# Patient Record
Sex: Female | Born: 1950
Health system: Southern US, Community
[De-identification: ages and names within clinical notes are randomized; demographics above are authoritative.]

## PROBLEM LIST (undated history)

## (undated) DIAGNOSIS — J45909 Unspecified asthma, uncomplicated: Secondary | ICD-10-CM

## (undated) HISTORY — DX: Unspecified asthma, uncomplicated: J45.909

---

## 2015-02-10 ENCOUNTER — Telehealth: Payer: Self-pay | Admitting: Oncology

## 2015-02-10 NOTE — Telephone Encounter (Signed)
NEW PATIENT APPT-S/W PATIENT AND GAVE NP APPT FOR 12/06 @ 1:30 W/dR. SHERRILL.

## 2015-04-11 ENCOUNTER — Telehealth: Payer: Self-pay | Admitting: *Deleted

## 2015-04-11 DIAGNOSIS — C189 Malignant neoplasm of colon, unspecified: Secondary | ICD-10-CM

## 2015-04-11 NOTE — Telephone Encounter (Signed)
Oncology Nurse Navigator Documentation  Oncology Nurse Navigator Flowsheets 04/11/2015  Referral date to RadOnc/MedOnc 02/05/2015  Navigator Encounter Type Introductory phone call  Called patient to confirm her appointment on 04/15/15. She does not have a CD of her scans or any other records at home. She does have the contact information from her surgeon in [place] if more records are needed. Records received have no operative note, colonoscopy reports, pathology report or actual scan reports.

## 2015-04-15 ENCOUNTER — Encounter: Payer: Self-pay | Admitting: Oncology

## 2015-04-15 ENCOUNTER — Other Ambulatory Visit (HOSPITAL_BASED_OUTPATIENT_CLINIC_OR_DEPARTMENT_OTHER): Payer: No Typology Code available for payment source

## 2015-04-15 ENCOUNTER — Encounter: Payer: Self-pay | Admitting: *Deleted

## 2015-04-15 ENCOUNTER — Ambulatory Visit (HOSPITAL_BASED_OUTPATIENT_CLINIC_OR_DEPARTMENT_OTHER): Payer: No Typology Code available for payment source | Admitting: Oncology

## 2015-04-15 VITALS — BP 138/80 | HR 87 | Temp 98.1°F | Resp 16 | Ht 60.0 in | Wt 167.0 lb

## 2015-04-15 DIAGNOSIS — J45909 Unspecified asthma, uncomplicated: Secondary | ICD-10-CM

## 2015-04-15 DIAGNOSIS — Z85038 Personal history of other malignant neoplasm of large intestine: Secondary | ICD-10-CM | POA: Diagnosis not present

## 2015-04-15 DIAGNOSIS — C189 Malignant neoplasm of colon, unspecified: Secondary | ICD-10-CM | POA: Insufficient documentation

## 2015-04-15 LAB — COMPREHENSIVE METABOLIC PANEL
ALBUMIN: 4 g/dL (ref 3.5–5.0)
ALT: 50 U/L (ref 0–55)
AST: 37 U/L — AB (ref 5–34)
Alkaline Phosphatase: 80 U/L (ref 40–150)
Anion Gap: 10 mEq/L (ref 3–11)
BUN: 19.9 mg/dL (ref 7.0–26.0)
CO2: 23 mEq/L (ref 22–29)
Calcium: 9.8 mg/dL (ref 8.4–10.4)
Chloride: 108 mEq/L (ref 98–109)
Creatinine: 0.8 mg/dL (ref 0.6–1.1)
EGFR: 79 mL/min/{1.73_m2} — ABNORMAL LOW (ref >90)
GLUCOSE: 132 mg/dL (ref 70–140)
Potassium: 3.7 mEq/L (ref 3.5–5.1)
SODIUM: 140 meq/L (ref 136–145)
TOTAL PROTEIN: 7.6 g/dL (ref 6.4–8.3)
Total Bilirubin: <0.3 mg/dL (ref 0.20–1.20)

## 2015-04-15 LAB — CBC WITH DIFFERENTIAL/PLATELET
BASO%: 0.7 % (ref 0.0–2.0)
Basophils Absolute: 0 10*3/uL (ref 0.0–0.1)
EOS ABS: 0.2 10*3/uL (ref 0.0–0.5)
EOS%: 3.1 % (ref 0.0–7.0)
HCT: 42.4 % (ref 34.8–46.6)
HEMOGLOBIN: 14.2 g/dL (ref 11.6–15.9)
LYMPH%: 29 % (ref 14.0–49.7)
MCH: 29.4 pg (ref 25.1–34.0)
MCHC: 33.5 g/dL (ref 31.5–36.0)
MCV: 87.7 fL (ref 79.5–101.0)
MONO#: 0.6 10*3/uL (ref 0.1–0.9)
MONO%: 9.1 % (ref 0.0–14.0)
NEUT%: 58.1 % (ref 38.4–76.8)
NEUTROS ABS: 4 10*3/uL (ref 1.5–6.5)
Platelets: 282 10*3/uL (ref 145–400)
RBC: 4.83 10*6/uL (ref 3.70–5.45)
RDW: 13.4 % (ref 11.2–14.5)
WBC: 7 10*3/uL (ref 3.9–10.3)
lymph#: 2 10*3/uL (ref 0.9–3.3)

## 2015-04-15 NOTE — Progress Notes (Signed)
Oncology Nurse Navigator Documentation  Oncology Nurse Navigator Flowsheets 04/15/2015  Referral date to RadOnc/MedOnc -  Navigator Encounter Type Initial MedOnc  Patient Visit Type Medonc  Treatment Phase No treatment-observation  Barriers/Navigation Needs No barriers at this time  Support Groups/Services GI and [place] [facility]  Time Spent with Patient 15  Met with patient after MD visit and provided contact information for Dr. Benay Spice and GI Navigator. Will followup to ensure her GI referral is completed. Does not require medical oncologist to follow her at this time. Needs PCP and followup there.

## 2015-04-15 NOTE — Progress Notes (Signed)
[facility] New Patient Consult   Referring MD: Karren Burly, [facility] 64 y.o.  [birth year]    Reason for Referral: Colon cancer   HPI: She reports being diagnosed with colon cancer on her first screening colonoscopy in 2009. We do not have the colonoscopy report or initial biopsy result available today. She underwent surgical resection of the primary tumor 05/08/2008  and was noted to have a well to moderately differentiated adenocarcinoma measuring 3.7 cm and invaded into but not through the muscularis propria. 12 lymph nodes were negative for tumor. She was referred to Dr. Olevia Bowens and adjuvant chemotherapy was not recommended.  She has been followed by Dr. Olevia Bowens and was last seen in December 2015. She reports a surveillance colonoscopy is due in 2017. The CEA returned at less than 0.5 on 04/09/2014.  Erica [name] has relocated to [facility]. She is referred to establish oncology care here. She will see Dr.John Laurann Montana for internal medicine care.  She feels well. No specific complaint today.  Past medical history: 1. Asthma/allergies 2. G2 P2   Past surgical history: 1. Hysterectomy for menorrhagia greater than 20 years ago   Medications: Reviewed  Allergies:  Allergies  Allergen Reactions  . Betadine [Povidone Iodine] Rash    Rash after surgery- likely related to Betadine    Family history: A maternal aunt was diagnosed with breast cancer at age 42. No other family history of cancer.  Social History:   She lives in [place] with her husband. She is retired after working in [employer]. Rare alcohol use. No tobacco use. No risk factor for HIV or hepatitis.     ROS:   Positives include: Intermittent flares of asthma-usually in the fall and spring. Irregular bowel habits. Recent rash over the right knee that has resolved  A complete ROS was otherwise negative.  Physical Exam:  Blood pressure 181/71, pulse 87,  temperature 98.1 F (36.7 C), temperature source Oral, resp. rate 16, height 5' (1.524 m), weight 167 lb (75.751 kg), SpO2 100 %.  HEENT: Neck without mass Lungs: Clear bilaterally Cardiac: Regular rate and rhythm Abdomen: No hepatomegaly, no mass, nontender  Vascular: No leg edema Lymph nodes: No cervical, supra-clavicular, axillary, or inguinal nodes Neurologic: Alert and oriented, the motor exam appears intact in the upper and lower extremities Skin: No rash Musculoskeletal: No spine tenderness   LAB:  CBC  Lab Results  Component Value Date   WBC 7.0 04/15/2015   HGB 14.2 04/15/2015   HCT 42.4 04/15/2015   MCV 87.7 04/15/2015   PLT 282 04/15/2015   NEUTROABS 4.0 04/15/2015     CMP: Potassium 3.7, crit 0.8, alkaline phosphatase is 80, albumin 4.0, AST 37, ALT 50, bili 0.3  CEA pending       Assessment/Plan:   1. Stage I (T2 N0) disease colon cancer diagnosed in December 2009  2. Asthma   Disposition:   Erica [name] was diagnosed with stage I colon cancer in December 2009. She remains in clinical remission. She has a good prognosis for a long-term disease-free survival. We discussed diet and exercise maneuvers that may decrease the risk of developing colon cancer. She should continue surveillance colonoscopies.  She plans to establish care with Dr. Lavone Orn within the next several months. I referred her to Dr.Magod for surveillance colonoscopies.  We will request the original operative note, pathology report, and baseline CEA from her oncologist in [place].  She is not scheduled for  a follow-up appointment at the [facility]. I am available to see her in the future as needed.  [place], [place] 04/15/2015, 2:25 PM

## 2015-04-16 LAB — CEA

## 2015-04-17 ENCOUNTER — Telehealth: Payer: Self-pay | Admitting: *Deleted

## 2015-04-17 NOTE — Telephone Encounter (Signed)
-----   Message from Ladell Pier, MD sent at 04/16/2015  5:11 PM EST ----- Please call patient Erica [name] is normal

## 2015-04-17 NOTE — Telephone Encounter (Signed)
Per Dr. Sherrill; notified pt that cea is normal.  Pt verbalized understanding. 

## 2015-04-22 ENCOUNTER — Ambulatory Visit: Payer: Self-pay | Admitting: Oncology

## 2015-04-22 ENCOUNTER — Other Ambulatory Visit: Payer: Self-pay

## 2015-05-14 ENCOUNTER — Telehealth: Payer: Self-pay | Admitting: *Deleted

## 2015-05-14 NOTE — Telephone Encounter (Signed)
Patient call requesting GI Navigator who called her.  Call transferred.  Voicemailo received.

## 2015-05-14 NOTE — Telephone Encounter (Signed)
Return call from [facility]. Her colonoscopy is due Sept 2017. She will call [facility] in August to schedule an appointment. Faxed note to [facility] referral coordinator with this information. [place] asking if Dr. Benay Spice had received her records from [facility] yet? Will f/u with MD about this.

## 2015-05-14 NOTE — Telephone Encounter (Signed)
Oncology Nurse Navigator Documentation  Oncology Nurse Navigator Flowsheets 05/14/2015  Navigator Location [facility]-Med Onc  Navigator Encounter Type Telephone  Telephone Outgoing Call  Patient Visit Type -  Treatment Phase -  Barriers/Navigation Needs -Needs colonoscopy 2017  Interventions Other-called [facility] to confirm referral was received and left VM at patient home with [facility] telephone # to call for her follow up there.  Support Groups/Services -  Time Spent with Patient -  Confirmed w/[facility] that referral was received and they have called patient twice with no return call. Mailed letter to her on 05/02/15.

## 2016-03-23 ENCOUNTER — Telehealth: Payer: Self-pay | Admitting: *Deleted

## 2016-03-23 NOTE — Telephone Encounter (Signed)
Call from pt requesting follow up visit with Dr. Benay Spice. Denies any new issues.  Discussed with Dr. Benay Spice: Pt to follow up with PCP as discussed. Will see her if needed for any oncology issues. Informed pt of above, she voiced understanding.

## 2016-05-04 ENCOUNTER — Ambulatory Visit: Payer: No Typology Code available for payment source | Admitting: Oncology

## 2016-05-04 ENCOUNTER — Other Ambulatory Visit: Payer: No Typology Code available for payment source

## 2016-07-26 ENCOUNTER — Encounter (INDEPENDENT_AMBULATORY_CARE_PROVIDER_SITE_OTHER): Payer: No Typology Code available for payment source | Admitting: Ophthalmology

## 2016-07-26 DIAGNOSIS — H43813 Vitreous degeneration, bilateral: Secondary | ICD-10-CM

## 2016-07-26 DIAGNOSIS — D3131 Benign neoplasm of right choroid: Secondary | ICD-10-CM | POA: Diagnosis not present

## 2016-07-26 DIAGNOSIS — H2513 Age-related nuclear cataract, bilateral: Secondary | ICD-10-CM

## 2016-09-02 DIAGNOSIS — H25813 Combined forms of age-related cataract, bilateral: Secondary | ICD-10-CM | POA: Insufficient documentation

## 2016-09-02 DIAGNOSIS — H353131 Nonexudative age-related macular degeneration, bilateral, early dry stage: Secondary | ICD-10-CM | POA: Insufficient documentation

## 2017-06-24 ENCOUNTER — Other Ambulatory Visit: Payer: Self-pay | Admitting: Internal Medicine

## 2017-06-24 ENCOUNTER — Ambulatory Visit
Admission: RE | Admit: 2017-06-24 | Discharge: 2017-06-24 | Disposition: A | Discharge status: Home / Self Care | Payer: No Typology Code available for payment source | Source: Ambulatory Visit | Attending: Internal Medicine | Admitting: Internal Medicine

## 2017-06-24 DIAGNOSIS — T1490XA Injury, unspecified, initial encounter: Secondary | ICD-10-CM

## 2017-07-27 ENCOUNTER — Ambulatory Visit (INDEPENDENT_AMBULATORY_CARE_PROVIDER_SITE_OTHER): Payer: No Typology Code available for payment source | Admitting: Ophthalmology

## 2017-08-03 ENCOUNTER — Ambulatory Visit (INDEPENDENT_AMBULATORY_CARE_PROVIDER_SITE_OTHER): Payer: No Typology Code available for payment source | Admitting: Ophthalmology

## 2018-04-03 DIAGNOSIS — Z23 Encounter for immunization: Secondary | ICD-10-CM | POA: Diagnosis not present

## 2018-04-03 DIAGNOSIS — K219 Gastro-esophageal reflux disease without esophagitis: Secondary | ICD-10-CM | POA: Diagnosis not present

## 2018-04-03 DIAGNOSIS — Z1389 Encounter for screening for other disorder: Secondary | ICD-10-CM | POA: Diagnosis not present

## 2018-04-03 DIAGNOSIS — F5104 Psychophysiologic insomnia: Secondary | ICD-10-CM | POA: Diagnosis not present

## 2018-04-03 DIAGNOSIS — Z0000 Encounter for general adult medical examination without abnormal findings: Secondary | ICD-10-CM | POA: Diagnosis not present

## 2018-07-04 DIAGNOSIS — H40013 Open angle with borderline findings, low risk, bilateral: Secondary | ICD-10-CM | POA: Diagnosis not present

## 2018-07-04 DIAGNOSIS — H353131 Nonexudative age-related macular degeneration, bilateral, early dry stage: Secondary | ICD-10-CM | POA: Diagnosis not present

## 2018-07-04 DIAGNOSIS — H25013 Cortical age-related cataract, bilateral: Secondary | ICD-10-CM | POA: Diagnosis not present

## 2018-07-04 DIAGNOSIS — H2513 Age-related nuclear cataract, bilateral: Secondary | ICD-10-CM | POA: Diagnosis not present

## 2018-10-18 DIAGNOSIS — L821 Other seborrheic keratosis: Secondary | ICD-10-CM | POA: Diagnosis not present

## 2018-10-18 DIAGNOSIS — L570 Actinic keratosis: Secondary | ICD-10-CM | POA: Diagnosis not present

## 2018-10-18 DIAGNOSIS — D1801 Hemangioma of skin and subcutaneous tissue: Secondary | ICD-10-CM | POA: Diagnosis not present

## 2018-10-18 DIAGNOSIS — L918 Other hypertrophic disorders of the skin: Secondary | ICD-10-CM | POA: Diagnosis not present

## 2018-10-18 DIAGNOSIS — D2362 Other benign neoplasm of skin of left upper limb, including shoulder: Secondary | ICD-10-CM | POA: Diagnosis not present

## 2018-10-18 DIAGNOSIS — D2271 Melanocytic nevi of right lower limb, including hip: Secondary | ICD-10-CM | POA: Diagnosis not present

## 2018-10-18 DIAGNOSIS — D2262 Melanocytic nevi of left upper limb, including shoulder: Secondary | ICD-10-CM | POA: Diagnosis not present

## 2018-10-18 DIAGNOSIS — D225 Melanocytic nevi of trunk: Secondary | ICD-10-CM | POA: Diagnosis not present

## 2018-10-18 DIAGNOSIS — D2272 Melanocytic nevi of left lower limb, including hip: Secondary | ICD-10-CM | POA: Diagnosis not present

## 2018-10-18 DIAGNOSIS — Z85828 Personal history of other malignant neoplasm of skin: Secondary | ICD-10-CM | POA: Diagnosis not present

## 2018-11-16 DIAGNOSIS — F329 Major depressive disorder, single episode, unspecified: Secondary | ICD-10-CM | POA: Diagnosis not present

## 2018-11-16 DIAGNOSIS — Z85038 Personal history of other malignant neoplasm of large intestine: Secondary | ICD-10-CM | POA: Diagnosis not present

## 2018-11-16 DIAGNOSIS — J4520 Mild intermittent asthma, uncomplicated: Secondary | ICD-10-CM | POA: Diagnosis not present

## 2019-01-08 DIAGNOSIS — Z01419 Encounter for gynecological examination (general) (routine) without abnormal findings: Secondary | ICD-10-CM | POA: Diagnosis not present

## 2019-01-08 DIAGNOSIS — Z6833 Body mass index (BMI) 33.0-33.9, adult: Secondary | ICD-10-CM | POA: Diagnosis not present

## 2019-01-08 DIAGNOSIS — Z1231 Encounter for screening mammogram for malignant neoplasm of breast: Secondary | ICD-10-CM | POA: Diagnosis not present

## 2019-02-07 DIAGNOSIS — Z23 Encounter for immunization: Secondary | ICD-10-CM | POA: Diagnosis not present

## 2019-03-17 IMAGING — CR DG HAND COMPLETE 3+V*R*
3 series · 3 of 3 positions shown · non-contrast
Comparison: No recent.

CLINICAL DATA: Right hand pain after fall. Pain right fourth and
fifth digits.

EXAM:
RIGHT HAND - COMPLETE 3+ VIEW

[x hand pa right]
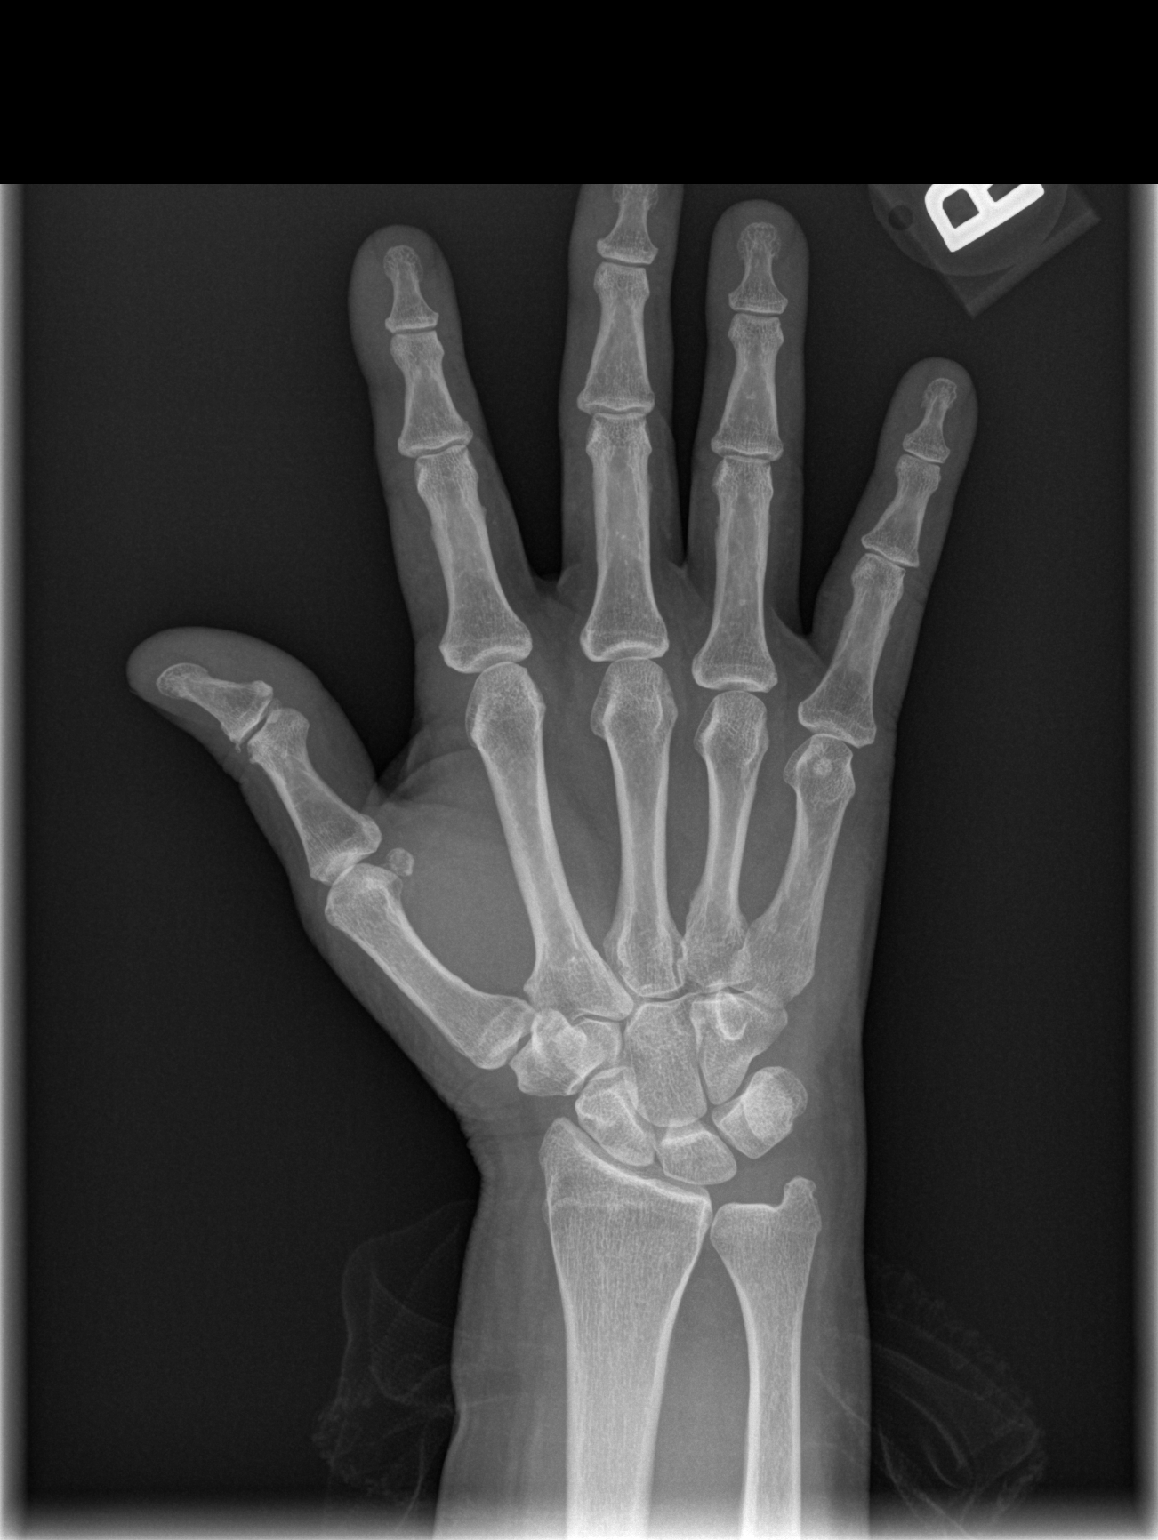

[x hand oblique right]
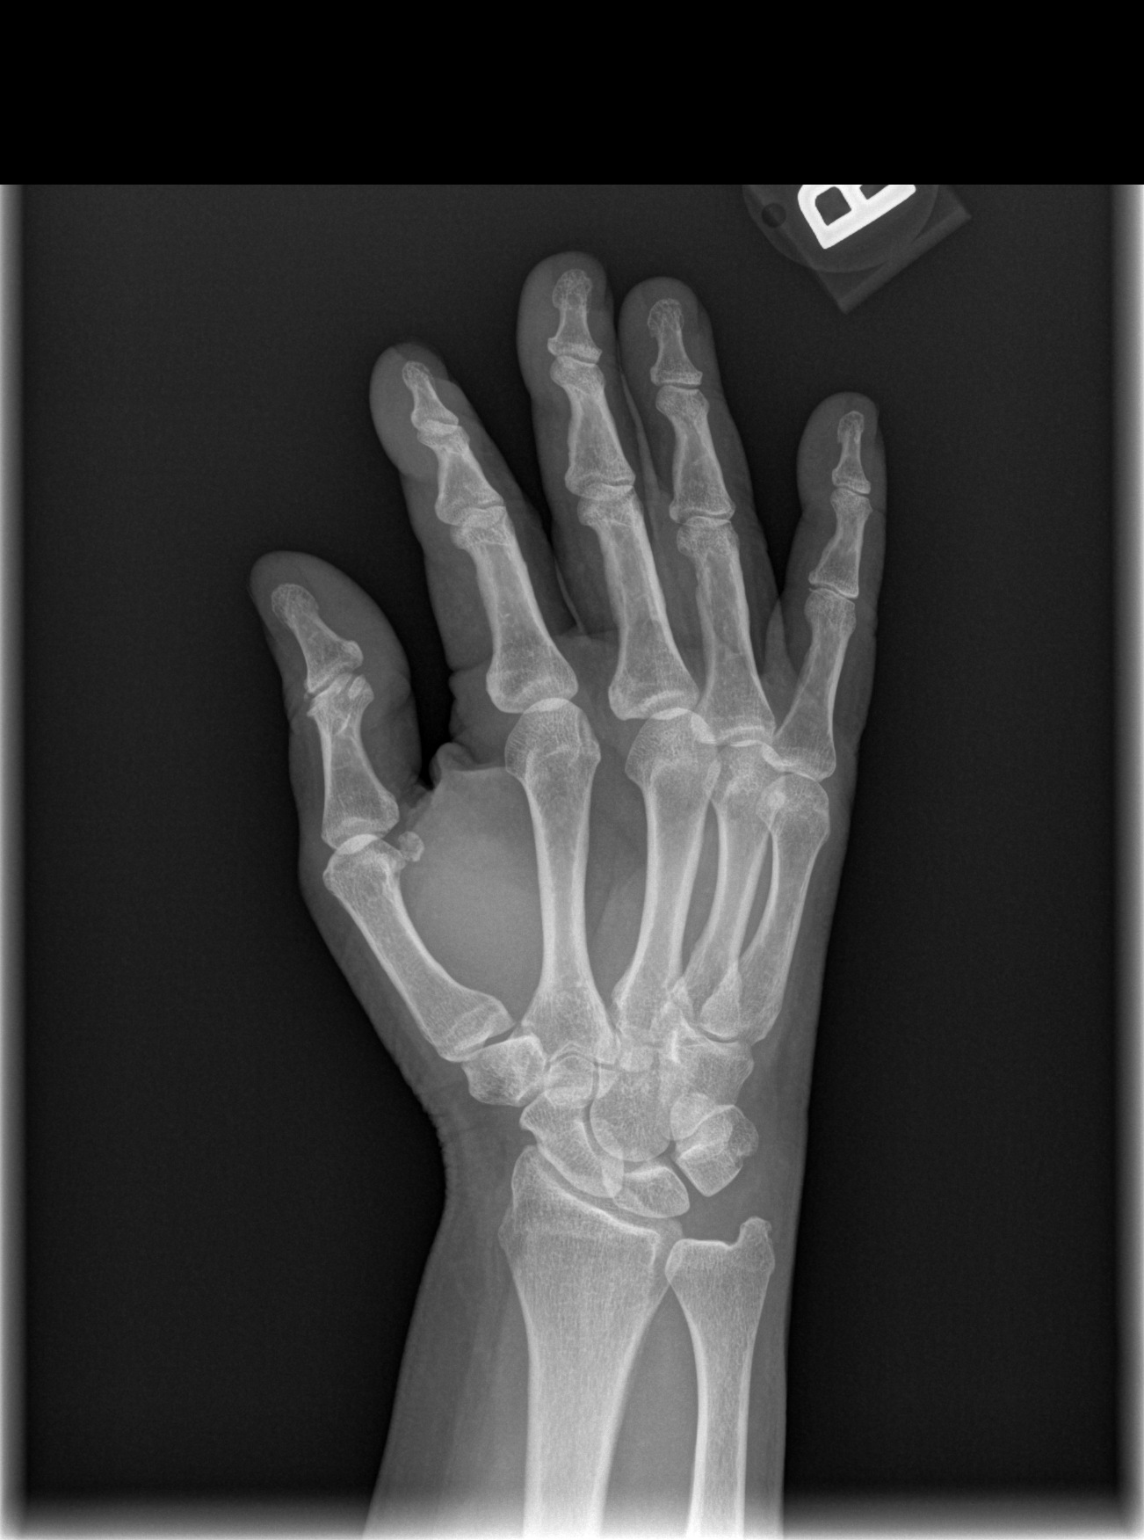

[x hand lat right]
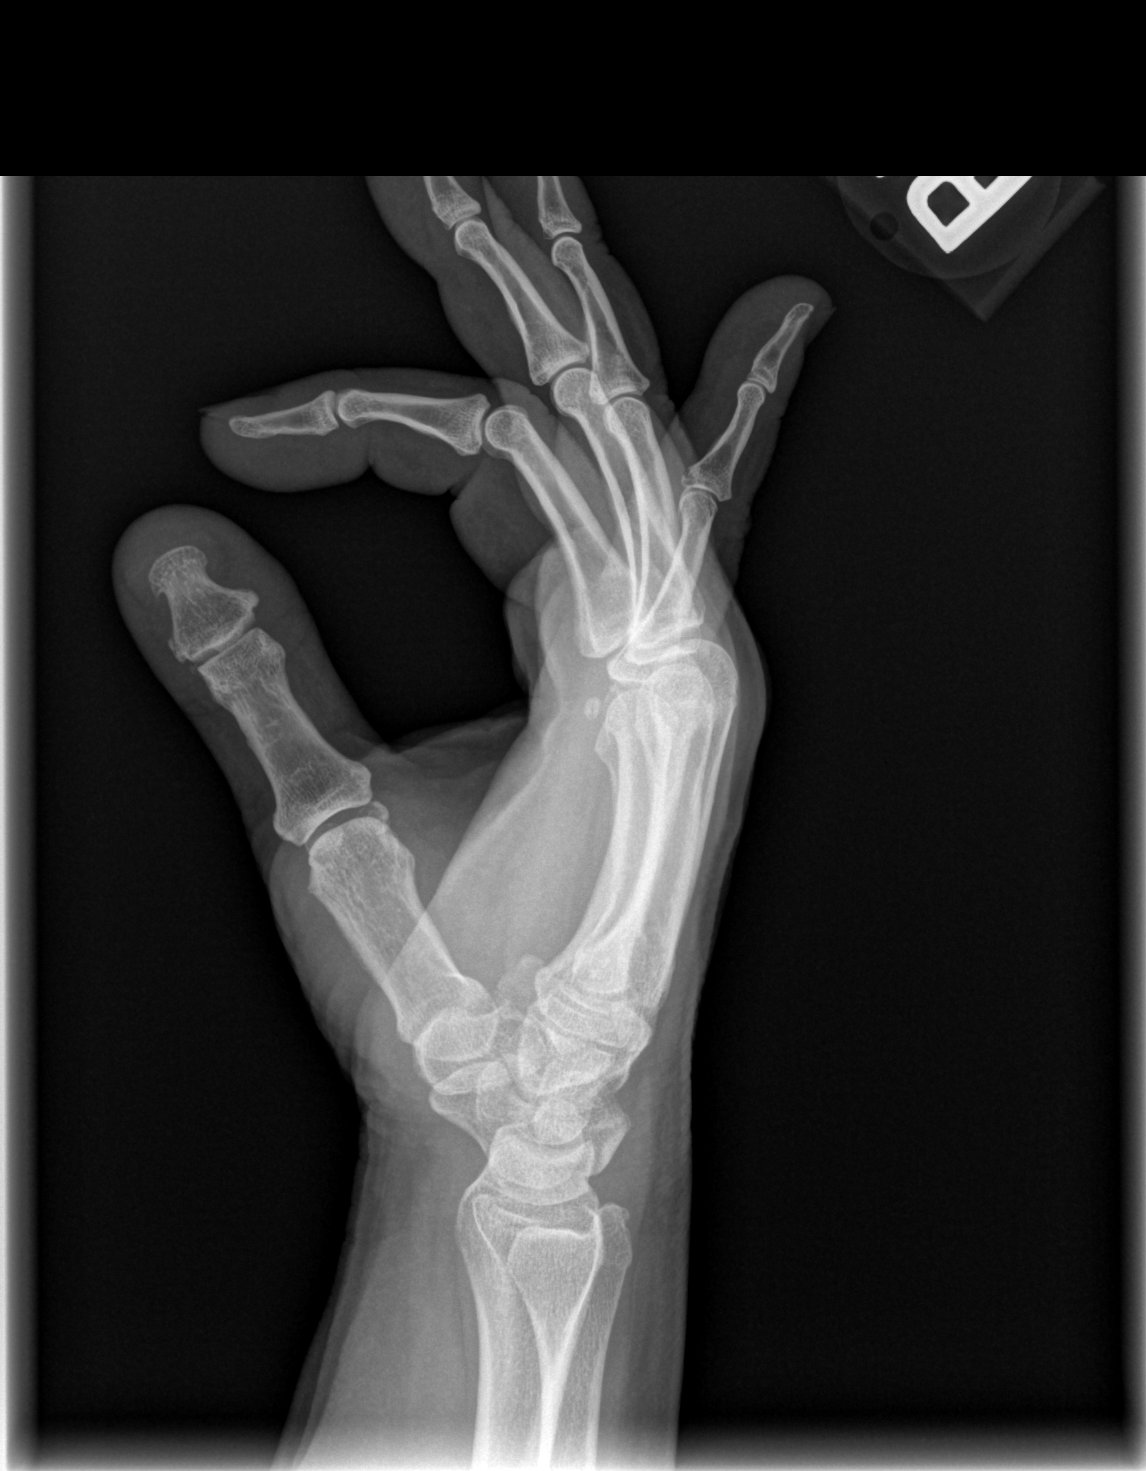

[3 of 3 positions shown; findings below may reference images not displayed]

FINDINGS: No acute bony or joint abnormality identified. Mild diffuse
degenerative change. Subtle fracture at the base of the middle
phalanx of the right fifth digit cannot be excluded. This may be
old. No other focal abnormality identified.
IMPRESSION: 1. Subtle fracture at the base of the middle phalanx of the right
fifth digit cannot be excluded. Age is undetermined.

2.  Diffuse mild degenerative change.

## 2019-04-16 DIAGNOSIS — Z0000 Encounter for general adult medical examination without abnormal findings: Secondary | ICD-10-CM | POA: Diagnosis not present

## 2019-04-16 DIAGNOSIS — Z1159 Encounter for screening for other viral diseases: Secondary | ICD-10-CM | POA: Diagnosis not present

## 2019-04-16 DIAGNOSIS — F5104 Psychophysiologic insomnia: Secondary | ICD-10-CM | POA: Diagnosis not present

## 2019-04-16 DIAGNOSIS — E7800 Pure hypercholesterolemia, unspecified: Secondary | ICD-10-CM | POA: Diagnosis not present

## 2019-04-16 DIAGNOSIS — F329 Major depressive disorder, single episode, unspecified: Secondary | ICD-10-CM | POA: Diagnosis not present

## 2019-04-16 DIAGNOSIS — Z1389 Encounter for screening for other disorder: Secondary | ICD-10-CM | POA: Diagnosis not present

## 2019-04-16 DIAGNOSIS — J301 Allergic rhinitis due to pollen: Secondary | ICD-10-CM | POA: Diagnosis not present

## 2019-04-16 DIAGNOSIS — M25561 Pain in right knee: Secondary | ICD-10-CM | POA: Diagnosis not present

## 2019-04-16 DIAGNOSIS — Z23 Encounter for immunization: Secondary | ICD-10-CM | POA: Diagnosis not present

## 2019-04-16 DIAGNOSIS — K219 Gastro-esophageal reflux disease without esophagitis: Secondary | ICD-10-CM | POA: Diagnosis not present

## 2019-04-18 ENCOUNTER — Ambulatory Visit
Admission: RE | Admit: 2019-04-18 | Discharge: 2019-04-18 | Disposition: A | Discharge status: Home / Self Care | Payer: Medicare Other | Source: Ambulatory Visit | Attending: Internal Medicine | Admitting: Internal Medicine

## 2019-04-18 ENCOUNTER — Other Ambulatory Visit: Payer: Self-pay | Admitting: Internal Medicine

## 2019-04-18 DIAGNOSIS — M25561 Pain in right knee: Secondary | ICD-10-CM | POA: Diagnosis not present

## 2019-04-18 DIAGNOSIS — E7800 Pure hypercholesterolemia, unspecified: Secondary | ICD-10-CM | POA: Diagnosis not present

## 2019-04-18 DIAGNOSIS — Z1159 Encounter for screening for other viral diseases: Secondary | ICD-10-CM | POA: Diagnosis not present

## 2019-06-09 ENCOUNTER — Ambulatory Visit: Payer: Medicare Other

## 2019-06-20 ENCOUNTER — Ambulatory Visit: Payer: Medicare Other

## 2019-07-12 DIAGNOSIS — H25013 Cortical age-related cataract, bilateral: Secondary | ICD-10-CM | POA: Diagnosis not present

## 2019-07-12 DIAGNOSIS — H2513 Age-related nuclear cataract, bilateral: Secondary | ICD-10-CM | POA: Diagnosis not present

## 2019-07-12 DIAGNOSIS — H353131 Nonexudative age-related macular degeneration, bilateral, early dry stage: Secondary | ICD-10-CM | POA: Diagnosis not present

## 2019-07-12 DIAGNOSIS — H40013 Open angle with borderline findings, low risk, bilateral: Secondary | ICD-10-CM | POA: Diagnosis not present

## 2019-10-21 DIAGNOSIS — R05 Cough: Secondary | ICD-10-CM | POA: Diagnosis not present

## 2019-10-21 DIAGNOSIS — J329 Chronic sinusitis, unspecified: Secondary | ICD-10-CM | POA: Diagnosis not present

## 2019-10-21 DIAGNOSIS — J45909 Unspecified asthma, uncomplicated: Secondary | ICD-10-CM | POA: Diagnosis not present

## 2019-10-24 DIAGNOSIS — D2362 Other benign neoplasm of skin of left upper limb, including shoulder: Secondary | ICD-10-CM | POA: Diagnosis not present

## 2019-10-24 DIAGNOSIS — L821 Other seborrheic keratosis: Secondary | ICD-10-CM | POA: Diagnosis not present

## 2019-10-24 DIAGNOSIS — D225 Melanocytic nevi of trunk: Secondary | ICD-10-CM | POA: Diagnosis not present

## 2019-10-24 DIAGNOSIS — D2272 Melanocytic nevi of left lower limb, including hip: Secondary | ICD-10-CM | POA: Diagnosis not present

## 2019-10-24 DIAGNOSIS — D2271 Melanocytic nevi of right lower limb, including hip: Secondary | ICD-10-CM | POA: Diagnosis not present

## 2019-10-24 DIAGNOSIS — D2262 Melanocytic nevi of left upper limb, including shoulder: Secondary | ICD-10-CM | POA: Diagnosis not present

## 2019-10-24 DIAGNOSIS — L720 Epidermal cyst: Secondary | ICD-10-CM | POA: Diagnosis not present

## 2019-10-24 DIAGNOSIS — D1801 Hemangioma of skin and subcutaneous tissue: Secondary | ICD-10-CM | POA: Diagnosis not present

## 2019-10-24 DIAGNOSIS — Z85828 Personal history of other malignant neoplasm of skin: Secondary | ICD-10-CM | POA: Diagnosis not present

## 2019-10-24 DIAGNOSIS — L814 Other melanin hyperpigmentation: Secondary | ICD-10-CM | POA: Diagnosis not present

## 2020-01-18 DIAGNOSIS — R8279 Other abnormal findings on microbiological examination of urine: Secondary | ICD-10-CM | POA: Diagnosis not present

## 2020-01-18 DIAGNOSIS — Z6831 Body mass index (BMI) 31.0-31.9, adult: Secondary | ICD-10-CM | POA: Diagnosis not present

## 2020-01-18 DIAGNOSIS — Z01419 Encounter for gynecological examination (general) (routine) without abnormal findings: Secondary | ICD-10-CM | POA: Diagnosis not present

## 2020-01-18 DIAGNOSIS — Z1231 Encounter for screening mammogram for malignant neoplasm of breast: Secondary | ICD-10-CM | POA: Diagnosis not present

## 2020-02-20 DIAGNOSIS — Z23 Encounter for immunization: Secondary | ICD-10-CM | POA: Diagnosis not present

## 2020-03-07 DIAGNOSIS — H353131 Nonexudative age-related macular degeneration, bilateral, early dry stage: Secondary | ICD-10-CM | POA: Diagnosis not present

## 2020-03-07 DIAGNOSIS — H25813 Combined forms of age-related cataract, bilateral: Secondary | ICD-10-CM | POA: Diagnosis not present

## 2020-03-07 DIAGNOSIS — H43812 Vitreous degeneration, left eye: Secondary | ICD-10-CM | POA: Diagnosis not present

## 2020-03-07 DIAGNOSIS — H5319 Other subjective visual disturbances: Secondary | ICD-10-CM | POA: Diagnosis not present

## 2020-03-07 DIAGNOSIS — H35363 Drusen (degenerative) of macula, bilateral: Secondary | ICD-10-CM | POA: Diagnosis not present

## 2020-03-19 DIAGNOSIS — H1045 Other chronic allergic conjunctivitis: Secondary | ICD-10-CM | POA: Diagnosis not present

## 2020-03-19 DIAGNOSIS — H43813 Vitreous degeneration, bilateral: Secondary | ICD-10-CM | POA: Diagnosis not present

## 2020-04-17 DIAGNOSIS — F5104 Psychophysiologic insomnia: Secondary | ICD-10-CM | POA: Diagnosis not present

## 2020-04-17 DIAGNOSIS — K219 Gastro-esophageal reflux disease without esophagitis: Secondary | ICD-10-CM | POA: Diagnosis not present

## 2020-04-17 DIAGNOSIS — I10 Essential (primary) hypertension: Secondary | ICD-10-CM | POA: Diagnosis not present

## 2020-04-17 DIAGNOSIS — Z0000 Encounter for general adult medical examination without abnormal findings: Secondary | ICD-10-CM | POA: Diagnosis not present

## 2020-04-17 DIAGNOSIS — F329 Major depressive disorder, single episode, unspecified: Secondary | ICD-10-CM | POA: Diagnosis not present

## 2020-04-17 DIAGNOSIS — J4520 Mild intermittent asthma, uncomplicated: Secondary | ICD-10-CM | POA: Diagnosis not present

## 2020-04-17 DIAGNOSIS — E7800 Pure hypercholesterolemia, unspecified: Secondary | ICD-10-CM | POA: Diagnosis not present

## 2020-04-17 DIAGNOSIS — Z1389 Encounter for screening for other disorder: Secondary | ICD-10-CM | POA: Diagnosis not present

## 2020-06-30 DIAGNOSIS — M79604 Pain in right leg: Secondary | ICD-10-CM | POA: Diagnosis not present

## 2020-06-30 DIAGNOSIS — I10 Essential (primary) hypertension: Secondary | ICD-10-CM | POA: Diagnosis not present

## 2020-06-30 DIAGNOSIS — M79605 Pain in left leg: Secondary | ICD-10-CM | POA: Diagnosis not present

## 2020-06-30 DIAGNOSIS — F5104 Psychophysiologic insomnia: Secondary | ICD-10-CM | POA: Diagnosis not present

## 2020-07-03 DIAGNOSIS — M79605 Pain in left leg: Secondary | ICD-10-CM | POA: Diagnosis not present

## 2020-07-03 DIAGNOSIS — M79604 Pain in right leg: Secondary | ICD-10-CM | POA: Diagnosis not present

## 2020-07-15 DIAGNOSIS — H353131 Nonexudative age-related macular degeneration, bilateral, early dry stage: Secondary | ICD-10-CM | POA: Diagnosis not present

## 2020-07-15 DIAGNOSIS — H2513 Age-related nuclear cataract, bilateral: Secondary | ICD-10-CM | POA: Diagnosis not present

## 2020-07-15 DIAGNOSIS — H25013 Cortical age-related cataract, bilateral: Secondary | ICD-10-CM | POA: Diagnosis not present

## 2020-07-15 DIAGNOSIS — H40013 Open angle with borderline findings, low risk, bilateral: Secondary | ICD-10-CM | POA: Diagnosis not present

## 2020-07-21 DIAGNOSIS — M79605 Pain in left leg: Secondary | ICD-10-CM | POA: Diagnosis not present

## 2020-07-21 DIAGNOSIS — M79604 Pain in right leg: Secondary | ICD-10-CM | POA: Diagnosis not present

## 2020-08-18 DIAGNOSIS — M79604 Pain in right leg: Secondary | ICD-10-CM | POA: Diagnosis not present

## 2020-08-18 DIAGNOSIS — M79605 Pain in left leg: Secondary | ICD-10-CM | POA: Diagnosis not present

## 2020-10-14 DIAGNOSIS — M79604 Pain in right leg: Secondary | ICD-10-CM | POA: Diagnosis not present

## 2020-10-14 DIAGNOSIS — M79605 Pain in left leg: Secondary | ICD-10-CM | POA: Diagnosis not present

## 2020-10-16 ENCOUNTER — Other Ambulatory Visit: Payer: Self-pay | Admitting: Internal Medicine

## 2020-10-16 DIAGNOSIS — M79604 Pain in right leg: Secondary | ICD-10-CM

## 2020-10-30 ENCOUNTER — Other Ambulatory Visit: Payer: Self-pay

## 2020-10-30 ENCOUNTER — Ambulatory Visit
Admission: RE | Admit: 2020-10-30 | Discharge: 2020-10-30 | Disposition: A | Discharge status: Home / Self Care | Payer: Medicare Other | Source: Ambulatory Visit | Attending: Internal Medicine | Admitting: Internal Medicine

## 2020-10-30 DIAGNOSIS — M545 Low back pain, unspecified: Secondary | ICD-10-CM | POA: Diagnosis not present

## 2020-10-30 DIAGNOSIS — M79604 Pain in right leg: Secondary | ICD-10-CM

## 2020-10-30 DIAGNOSIS — M48061 Spinal stenosis, lumbar region without neurogenic claudication: Secondary | ICD-10-CM | POA: Diagnosis not present

## 2020-11-04 DIAGNOSIS — D1801 Hemangioma of skin and subcutaneous tissue: Secondary | ICD-10-CM | POA: Diagnosis not present

## 2020-11-04 DIAGNOSIS — L814 Other melanin hyperpigmentation: Secondary | ICD-10-CM | POA: Diagnosis not present

## 2020-11-04 DIAGNOSIS — L821 Other seborrheic keratosis: Secondary | ICD-10-CM | POA: Diagnosis not present

## 2020-11-04 DIAGNOSIS — D2271 Melanocytic nevi of right lower limb, including hip: Secondary | ICD-10-CM | POA: Diagnosis not present

## 2020-11-04 DIAGNOSIS — D225 Melanocytic nevi of trunk: Secondary | ICD-10-CM | POA: Diagnosis not present

## 2020-11-04 DIAGNOSIS — D2272 Melanocytic nevi of left lower limb, including hip: Secondary | ICD-10-CM | POA: Diagnosis not present

## 2020-11-04 DIAGNOSIS — D2262 Melanocytic nevi of left upper limb, including shoulder: Secondary | ICD-10-CM | POA: Diagnosis not present

## 2020-11-04 DIAGNOSIS — Z85828 Personal history of other malignant neoplasm of skin: Secondary | ICD-10-CM | POA: Diagnosis not present

## 2020-11-04 DIAGNOSIS — D2239 Melanocytic nevi of other parts of face: Secondary | ICD-10-CM | POA: Diagnosis not present

## 2020-11-12 ENCOUNTER — Other Ambulatory Visit: Payer: Self-pay

## 2020-11-12 ENCOUNTER — Encounter: Payer: Self-pay | Admitting: Neurology

## 2020-11-12 DIAGNOSIS — R202 Paresthesia of skin: Secondary | ICD-10-CM

## 2020-12-23 ENCOUNTER — Encounter: Payer: Medicare Other | Admitting: Neurology

## 2021-01-08 IMAGING — CR DG KNEE COMPLETE 4+V*R*
4 series · 4 of 4 positions shown · non-contrast
Comparison: None.

CLINICAL DATA: Right knee pain for several weeks.  No known injury.

EXAM:
RIGHT KNEE - COMPLETE 4+ VIEW

[t knee ap right]
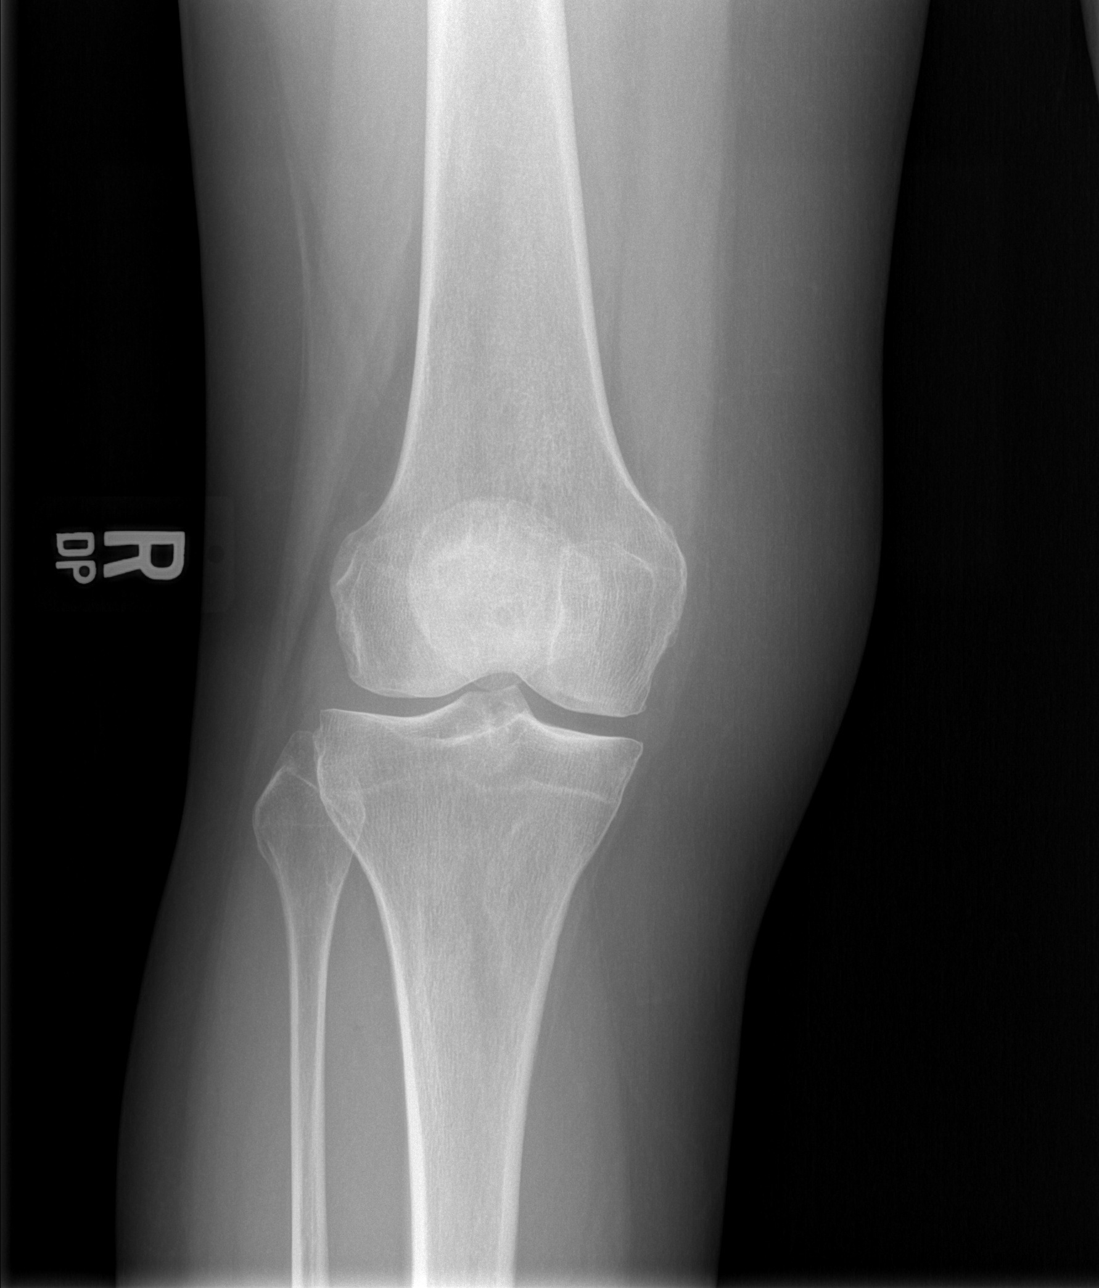

[t knee oblique right (1 of 2)]
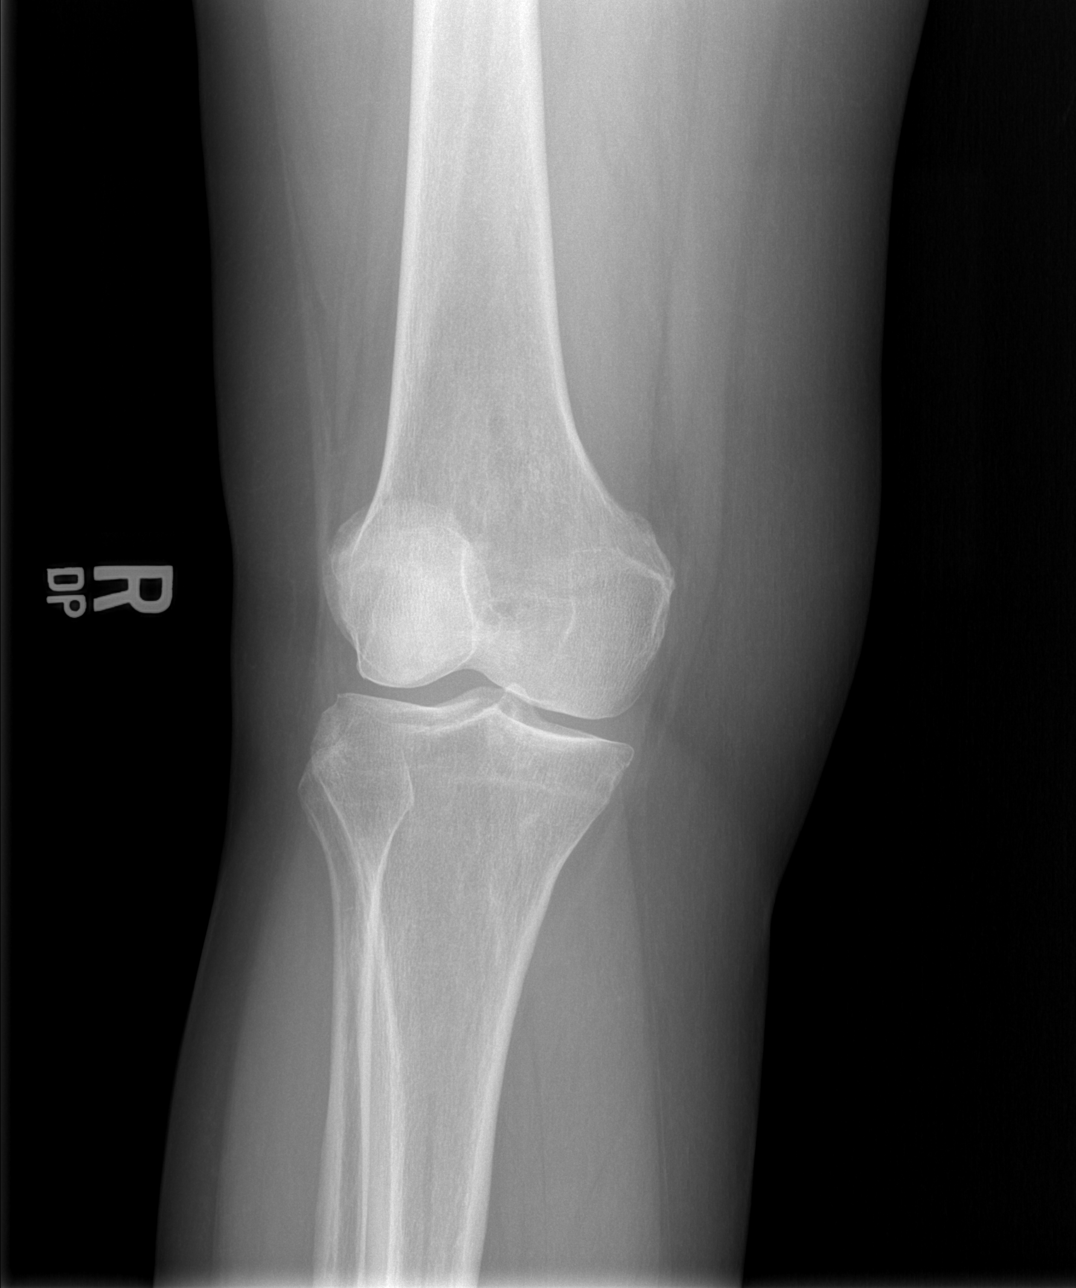

[t knee oblique right (2 of 2)]
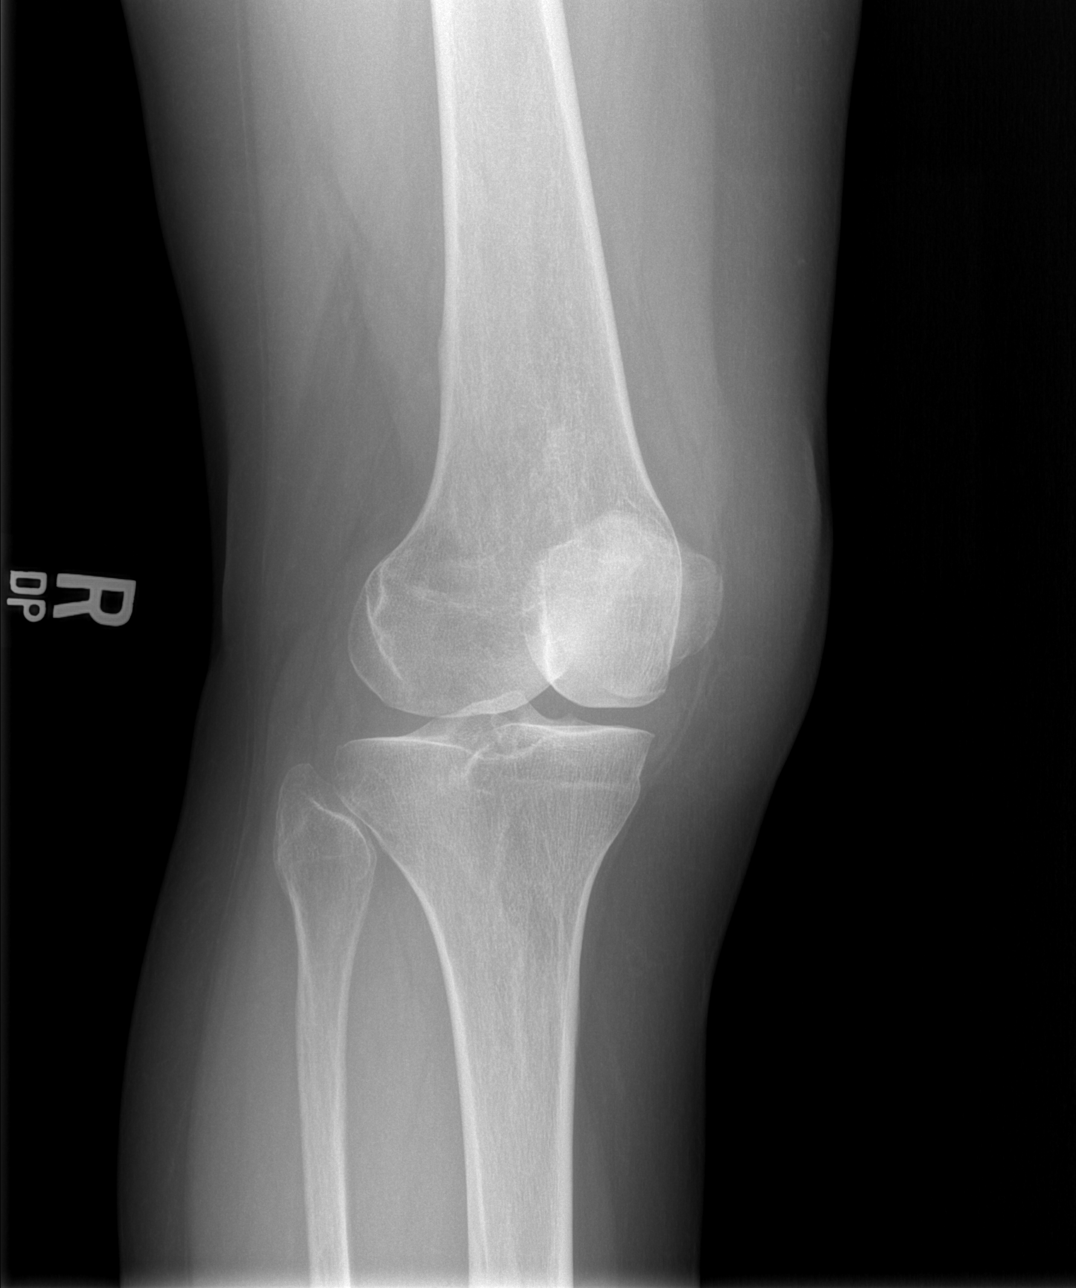

[t knee lat right]
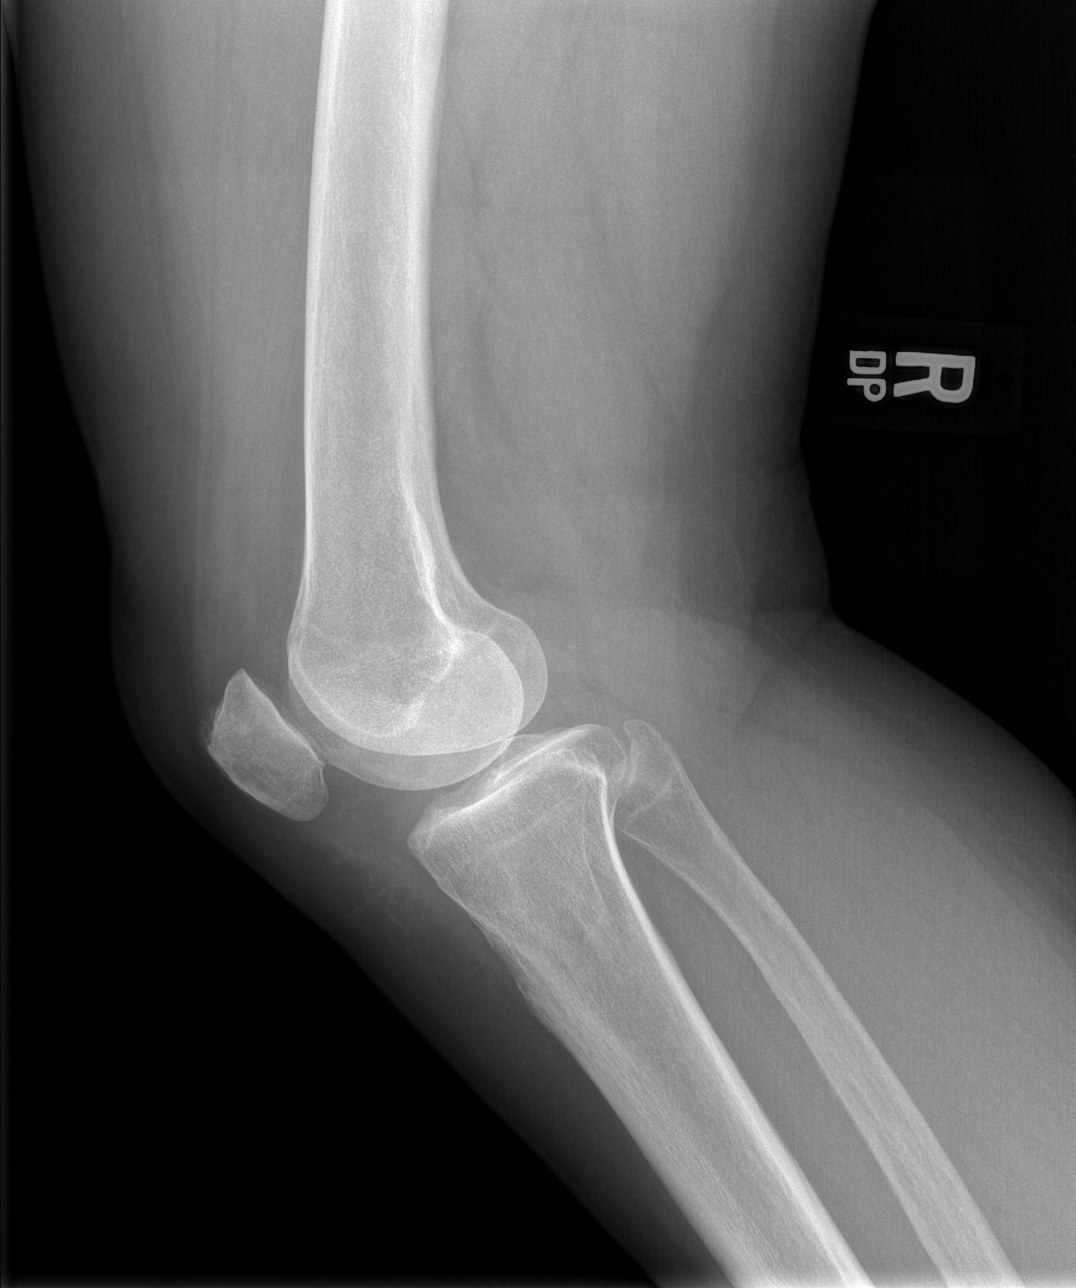

[4 of 4 positions shown; findings below may reference images not displayed]

FINDINGS: No evidence of fracture, dislocation, or joint effusion. No evidence
of arthropathy or other focal bone abnormality. Soft tissues are
unremarkable.
IMPRESSION: Negative.

## 2021-02-18 DIAGNOSIS — Z23 Encounter for immunization: Secondary | ICD-10-CM | POA: Diagnosis not present

## 2021-03-10 DIAGNOSIS — Z124 Encounter for screening for malignant neoplasm of cervix: Secondary | ICD-10-CM | POA: Diagnosis not present

## 2021-03-10 DIAGNOSIS — L292 Pruritus vulvae: Secondary | ICD-10-CM | POA: Diagnosis not present

## 2021-03-10 DIAGNOSIS — Z1231 Encounter for screening mammogram for malignant neoplasm of breast: Secondary | ICD-10-CM | POA: Diagnosis not present

## 2021-03-10 DIAGNOSIS — Z6832 Body mass index (BMI) 32.0-32.9, adult: Secondary | ICD-10-CM | POA: Diagnosis not present

## 2021-03-11 LAB — HM PAP SMEAR

## 2021-04-07 ENCOUNTER — Ambulatory Visit (INDEPENDENT_AMBULATORY_CARE_PROVIDER_SITE_OTHER): Payer: Medicare Other | Admitting: Neurology

## 2021-04-07 ENCOUNTER — Other Ambulatory Visit: Payer: Self-pay

## 2021-04-07 DIAGNOSIS — R202 Paresthesia of skin: Secondary | ICD-10-CM | POA: Diagnosis not present

## 2021-04-07 DIAGNOSIS — M79604 Pain in right leg: Secondary | ICD-10-CM

## 2021-04-07 NOTE — Procedures (Signed)
[facility]  [address], [place]  [place],  [address] Tel: [phone] Fax:  [phone] Test Date:  04/07/2021  Patient: Erica [name] DOB: 1950/11/16 Physician: Narda Amber, DO  Sex: Female Height: 5' " Ref Phys: Delanna Ahmadi, M.D.  ID#: [id number]   Technician:    Patient Complaints: This is a 69 year old female referred for evaluation of right leg pain.  NCV & EMG Findings: Extensive electrodiagnostic testing of the right lower extremity shows: Right superficial peroneal sensory responses are within normal limits. Right peroneal and tibial motor responses are within normal limits. Right tibial H reflex study is within normal limits. There is no evidence of active or chronic motor axonal loss changes affecting any of the tested muscles.  Motor unit configuration and recruitment pattern is within normal limits.  Impression: This is a normal study of the right lower extremity.  In particular, there is no evidence of a sensorimotor polyneuropathy, myopathy, or lumbosacral radiculopathy.   ___________________________ Narda Amber, DO    Nerve Conduction Studies Anti Sensory Summary Table   Stim Site NR Peak (ms) Norm Peak (ms) P-T Amp (V) Norm P-T Amp  Right Sup Peroneal Anti Sensory (Ant Lat Mall)  33C  12 cm    2.5 <4.6 7.1 >3  Right Sural Anti Sensory (Lat Mall)  33C  Calf    2.9 <4.6 12.4 >3   Motor Summary Table   Stim Site NR Onset (ms) Norm Onset (ms) O-P Amp (mV) Norm O-P Amp Site1 Site2 Delta-0 (ms) Dist (cm) Vel (m/s) Norm Vel (m/s)  Right Peroneal Motor (Ext Dig Brev)  33C  Ankle    2.6 <6.0 4.5 >2.5 B Fib Ankle 6.5 36.0 55 >40  B Fib    9.1  4.3  Poplt B Fib 1.3 8.0 62 >40  Poplt    10.4  4.0         Right Tibial Motor (Abd Hall Brev)  33C  Ankle    4.1 <6.0 15.0 >4 Knee Ankle 6.3 39.0 62 >40  Knee    10.4  12.3          H Reflex Studies   NR H-Lat (ms) Lat Norm (ms) L-R H-Lat (ms)  Right Tibial (Gastroc)  33C      28.30 <35    EMG   Side Muscle Ins Act Fibs Psw Fasc Number Recrt Dur Dur. Amp Amp. Poly Poly. Comment  Right AntTibialis Nml Nml Nml Nml Nml Nml Nml Nml Nml Nml Nml Nml N/A  Right Gastroc Nml Nml Nml Nml Nml Nml Nml Nml Nml Nml Nml Nml N/A  Right Flex Dig Long Nml Nml Nml Nml Nml Nml Nml Nml Nml Nml Nml Nml N/A  Right RectFemoris Nml Nml Nml Nml Nml Nml Nml Nml Nml Nml Nml Nml N/A  Right GluteusMed Nml Nml Nml Nml Nml Nml Nml Nml Nml Nml Nml Nml N/A  Right Fibularis Long Nml Nml Nml Nml Nml Nml Nml Nml Nml Nml Nml Nml N/A      Waveforms:

## 2021-04-23 DIAGNOSIS — R200 Anesthesia of skin: Secondary | ICD-10-CM | POA: Diagnosis not present

## 2021-04-23 DIAGNOSIS — F329 Major depressive disorder, single episode, unspecified: Secondary | ICD-10-CM | POA: Diagnosis not present

## 2021-04-23 DIAGNOSIS — I10 Essential (primary) hypertension: Secondary | ICD-10-CM | POA: Diagnosis not present

## 2021-04-23 DIAGNOSIS — M79604 Pain in right leg: Secondary | ICD-10-CM | POA: Diagnosis not present

## 2021-04-23 DIAGNOSIS — R202 Paresthesia of skin: Secondary | ICD-10-CM | POA: Diagnosis not present

## 2021-04-23 DIAGNOSIS — E7800 Pure hypercholesterolemia, unspecified: Secondary | ICD-10-CM | POA: Diagnosis not present

## 2021-04-23 DIAGNOSIS — K219 Gastro-esophageal reflux disease without esophagitis: Secondary | ICD-10-CM | POA: Diagnosis not present

## 2021-04-23 DIAGNOSIS — J4520 Mild intermittent asthma, uncomplicated: Secondary | ICD-10-CM | POA: Diagnosis not present

## 2021-04-23 DIAGNOSIS — Z0000 Encounter for general adult medical examination without abnormal findings: Secondary | ICD-10-CM | POA: Diagnosis not present

## 2021-06-02 DIAGNOSIS — H353211 Exudative age-related macular degeneration, right eye, with active choroidal neovascularization: Secondary | ICD-10-CM | POA: Diagnosis not present

## 2021-06-02 DIAGNOSIS — H25813 Combined forms of age-related cataract, bilateral: Secondary | ICD-10-CM | POA: Diagnosis not present

## 2021-06-02 DIAGNOSIS — H353122 Nonexudative age-related macular degeneration, left eye, intermediate dry stage: Secondary | ICD-10-CM | POA: Diagnosis not present

## 2021-06-02 DIAGNOSIS — H40013 Open angle with borderline findings, low risk, bilateral: Secondary | ICD-10-CM | POA: Diagnosis not present

## 2021-06-04 DIAGNOSIS — I10 Essential (primary) hypertension: Secondary | ICD-10-CM | POA: Diagnosis not present

## 2021-06-04 DIAGNOSIS — M5416 Radiculopathy, lumbar region: Secondary | ICD-10-CM | POA: Diagnosis not present

## 2021-06-04 DIAGNOSIS — Z6833 Body mass index (BMI) 33.0-33.9, adult: Secondary | ICD-10-CM | POA: Diagnosis not present

## 2021-06-08 ENCOUNTER — Encounter (INDEPENDENT_AMBULATORY_CARE_PROVIDER_SITE_OTHER): Payer: Medicare Other | Admitting: Ophthalmology

## 2021-06-08 ENCOUNTER — Other Ambulatory Visit: Payer: Self-pay

## 2021-06-08 DIAGNOSIS — H35033 Hypertensive retinopathy, bilateral: Secondary | ICD-10-CM | POA: Diagnosis not present

## 2021-06-08 DIAGNOSIS — H353122 Nonexudative age-related macular degeneration, left eye, intermediate dry stage: Secondary | ICD-10-CM | POA: Diagnosis not present

## 2021-06-08 DIAGNOSIS — H30891 Other chorioretinal inflammations, right eye: Secondary | ICD-10-CM | POA: Diagnosis not present

## 2021-06-08 DIAGNOSIS — I10 Essential (primary) hypertension: Secondary | ICD-10-CM

## 2021-06-08 DIAGNOSIS — H353211 Exudative age-related macular degeneration, right eye, with active choroidal neovascularization: Secondary | ICD-10-CM

## 2021-06-08 DIAGNOSIS — H2513 Age-related nuclear cataract, bilateral: Secondary | ICD-10-CM

## 2021-06-08 DIAGNOSIS — H43813 Vitreous degeneration, bilateral: Secondary | ICD-10-CM

## 2021-06-18 DIAGNOSIS — H40011 Open angle with borderline findings, low risk, right eye: Secondary | ICD-10-CM | POA: Diagnosis not present

## 2021-07-03 DIAGNOSIS — H40013 Open angle with borderline findings, low risk, bilateral: Secondary | ICD-10-CM | POA: Diagnosis not present

## 2021-07-03 DIAGNOSIS — H25813 Combined forms of age-related cataract, bilateral: Secondary | ICD-10-CM | POA: Diagnosis not present

## 2021-07-06 ENCOUNTER — Encounter (INDEPENDENT_AMBULATORY_CARE_PROVIDER_SITE_OTHER): Payer: Medicare Other | Admitting: Ophthalmology

## 2021-07-06 ENCOUNTER — Other Ambulatory Visit: Payer: Self-pay

## 2021-07-06 DIAGNOSIS — I10 Essential (primary) hypertension: Secondary | ICD-10-CM | POA: Diagnosis not present

## 2021-07-06 DIAGNOSIS — H2513 Age-related nuclear cataract, bilateral: Secondary | ICD-10-CM

## 2021-07-06 DIAGNOSIS — H43813 Vitreous degeneration, bilateral: Secondary | ICD-10-CM

## 2021-07-06 DIAGNOSIS — H353211 Exudative age-related macular degeneration, right eye, with active choroidal neovascularization: Secondary | ICD-10-CM

## 2021-07-06 DIAGNOSIS — H35033 Hypertensive retinopathy, bilateral: Secondary | ICD-10-CM

## 2021-07-06 DIAGNOSIS — H353122 Nonexudative age-related macular degeneration, left eye, intermediate dry stage: Secondary | ICD-10-CM | POA: Diagnosis not present

## 2021-07-09 DIAGNOSIS — M5416 Radiculopathy, lumbar region: Secondary | ICD-10-CM | POA: Diagnosis not present

## 2021-07-10 ENCOUNTER — Ambulatory Visit: Payer: Medicare Other | Admitting: Neurology

## 2021-07-22 DIAGNOSIS — M79604 Pain in right leg: Secondary | ICD-10-CM | POA: Diagnosis not present

## 2021-07-22 DIAGNOSIS — M5416 Radiculopathy, lumbar region: Secondary | ICD-10-CM | POA: Diagnosis not present

## 2021-08-03 ENCOUNTER — Other Ambulatory Visit: Payer: Self-pay

## 2021-08-03 ENCOUNTER — Encounter (INDEPENDENT_AMBULATORY_CARE_PROVIDER_SITE_OTHER): Payer: Medicare Other | Admitting: Ophthalmology

## 2021-08-03 DIAGNOSIS — H43813 Vitreous degeneration, bilateral: Secondary | ICD-10-CM

## 2021-08-03 DIAGNOSIS — H35033 Hypertensive retinopathy, bilateral: Secondary | ICD-10-CM

## 2021-08-03 DIAGNOSIS — M545 Low back pain, unspecified: Secondary | ICD-10-CM | POA: Diagnosis not present

## 2021-08-03 DIAGNOSIS — H2513 Age-related nuclear cataract, bilateral: Secondary | ICD-10-CM

## 2021-08-03 DIAGNOSIS — H353211 Exudative age-related macular degeneration, right eye, with active choroidal neovascularization: Secondary | ICD-10-CM | POA: Diagnosis not present

## 2021-08-03 DIAGNOSIS — I10 Essential (primary) hypertension: Secondary | ICD-10-CM | POA: Diagnosis not present

## 2021-08-03 DIAGNOSIS — H30899 Other chorioretinal inflammations, unspecified eye: Secondary | ICD-10-CM

## 2021-08-03 DIAGNOSIS — H353122 Nonexudative age-related macular degeneration, left eye, intermediate dry stage: Secondary | ICD-10-CM | POA: Diagnosis not present

## 2021-08-10 DIAGNOSIS — M545 Low back pain, unspecified: Secondary | ICD-10-CM | POA: Diagnosis not present

## 2021-08-12 DIAGNOSIS — M545 Low back pain, unspecified: Secondary | ICD-10-CM | POA: Diagnosis not present

## 2021-08-17 DIAGNOSIS — M545 Low back pain, unspecified: Secondary | ICD-10-CM | POA: Diagnosis not present

## 2021-08-24 DIAGNOSIS — M5416 Radiculopathy, lumbar region: Secondary | ICD-10-CM | POA: Diagnosis not present

## 2021-08-31 ENCOUNTER — Encounter (INDEPENDENT_AMBULATORY_CARE_PROVIDER_SITE_OTHER): Payer: Medicare Other | Admitting: Ophthalmology

## 2021-08-31 DIAGNOSIS — H353211 Exudative age-related macular degeneration, right eye, with active choroidal neovascularization: Secondary | ICD-10-CM

## 2021-08-31 DIAGNOSIS — M545 Low back pain, unspecified: Secondary | ICD-10-CM | POA: Diagnosis not present

## 2021-08-31 DIAGNOSIS — H353122 Nonexudative age-related macular degeneration, left eye, intermediate dry stage: Secondary | ICD-10-CM

## 2021-08-31 DIAGNOSIS — H43813 Vitreous degeneration, bilateral: Secondary | ICD-10-CM | POA: Diagnosis not present

## 2021-08-31 DIAGNOSIS — H30899 Other chorioretinal inflammations, unspecified eye: Secondary | ICD-10-CM | POA: Diagnosis not present

## 2021-09-13 DIAGNOSIS — U071 COVID-19: Secondary | ICD-10-CM | POA: Diagnosis not present

## 2021-09-23 DIAGNOSIS — Z23 Encounter for immunization: Secondary | ICD-10-CM | POA: Diagnosis not present

## 2021-09-23 DIAGNOSIS — M545 Low back pain, unspecified: Secondary | ICD-10-CM | POA: Diagnosis not present

## 2021-09-28 ENCOUNTER — Encounter (INDEPENDENT_AMBULATORY_CARE_PROVIDER_SITE_OTHER): Payer: Medicare Other | Admitting: Ophthalmology

## 2021-09-28 DIAGNOSIS — H353122 Nonexudative age-related macular degeneration, left eye, intermediate dry stage: Secondary | ICD-10-CM

## 2021-09-28 DIAGNOSIS — H353211 Exudative age-related macular degeneration, right eye, with active choroidal neovascularization: Secondary | ICD-10-CM

## 2021-09-28 DIAGNOSIS — H43813 Vitreous degeneration, bilateral: Secondary | ICD-10-CM | POA: Diagnosis not present

## 2021-09-28 DIAGNOSIS — H30899 Other chorioretinal inflammations, unspecified eye: Secondary | ICD-10-CM

## 2021-09-30 DIAGNOSIS — M545 Low back pain, unspecified: Secondary | ICD-10-CM | POA: Diagnosis not present

## 2021-10-07 DIAGNOSIS — M5416 Radiculopathy, lumbar region: Secondary | ICD-10-CM | POA: Diagnosis not present

## 2021-10-07 DIAGNOSIS — M545 Low back pain, unspecified: Secondary | ICD-10-CM | POA: Diagnosis not present

## 2021-10-13 DIAGNOSIS — M545 Low back pain, unspecified: Secondary | ICD-10-CM | POA: Diagnosis not present

## 2021-10-15 DIAGNOSIS — E2839 Other primary ovarian failure: Secondary | ICD-10-CM | POA: Diagnosis not present

## 2021-10-15 DIAGNOSIS — I10 Essential (primary) hypertension: Secondary | ICD-10-CM | POA: Diagnosis not present

## 2021-10-15 DIAGNOSIS — M5416 Radiculopathy, lumbar region: Secondary | ICD-10-CM | POA: Diagnosis not present

## 2021-10-15 DIAGNOSIS — F329 Major depressive disorder, single episode, unspecified: Secondary | ICD-10-CM | POA: Diagnosis not present

## 2021-10-15 DIAGNOSIS — K219 Gastro-esophageal reflux disease without esophagitis: Secondary | ICD-10-CM | POA: Diagnosis not present

## 2021-10-28 DIAGNOSIS — U071 COVID-19: Secondary | ICD-10-CM | POA: Diagnosis not present

## 2021-10-28 DIAGNOSIS — J00 Acute nasopharyngitis [common cold]: Secondary | ICD-10-CM | POA: Diagnosis not present

## 2021-10-28 DIAGNOSIS — R509 Fever, unspecified: Secondary | ICD-10-CM | POA: Diagnosis not present

## 2021-10-28 DIAGNOSIS — J069 Acute upper respiratory infection, unspecified: Secondary | ICD-10-CM | POA: Diagnosis not present

## 2021-11-02 ENCOUNTER — Encounter (INDEPENDENT_AMBULATORY_CARE_PROVIDER_SITE_OTHER): Payer: Medicare Other | Admitting: Ophthalmology

## 2021-11-09 ENCOUNTER — Encounter (INDEPENDENT_AMBULATORY_CARE_PROVIDER_SITE_OTHER): Payer: Medicare Other | Admitting: Ophthalmology

## 2021-11-09 DIAGNOSIS — H43813 Vitreous degeneration, bilateral: Secondary | ICD-10-CM

## 2021-11-09 DIAGNOSIS — H35033 Hypertensive retinopathy, bilateral: Secondary | ICD-10-CM | POA: Diagnosis not present

## 2021-11-09 DIAGNOSIS — H353211 Exudative age-related macular degeneration, right eye, with active choroidal neovascularization: Secondary | ICD-10-CM | POA: Diagnosis not present

## 2021-11-09 DIAGNOSIS — H353122 Nonexudative age-related macular degeneration, left eye, intermediate dry stage: Secondary | ICD-10-CM

## 2021-11-09 DIAGNOSIS — I10 Essential (primary) hypertension: Secondary | ICD-10-CM

## 2021-11-11 DIAGNOSIS — D72828 Other elevated white blood cell count: Secondary | ICD-10-CM | POA: Diagnosis not present

## 2021-11-11 DIAGNOSIS — Z8616 Personal history of COVID-19: Secondary | ICD-10-CM | POA: Diagnosis not present

## 2021-11-11 DIAGNOSIS — I10 Essential (primary) hypertension: Secondary | ICD-10-CM | POA: Diagnosis not present

## 2021-11-11 DIAGNOSIS — J4520 Mild intermittent asthma, uncomplicated: Secondary | ICD-10-CM | POA: Diagnosis not present

## 2021-11-13 DIAGNOSIS — M85852 Other specified disorders of bone density and structure, left thigh: Secondary | ICD-10-CM | POA: Diagnosis not present

## 2021-11-13 DIAGNOSIS — Z780 Asymptomatic menopausal state: Secondary | ICD-10-CM | POA: Diagnosis not present

## 2021-11-13 DIAGNOSIS — M85851 Other specified disorders of bone density and structure, right thigh: Secondary | ICD-10-CM | POA: Diagnosis not present

## 2021-11-16 DIAGNOSIS — M545 Low back pain, unspecified: Secondary | ICD-10-CM | POA: Diagnosis not present

## 2021-11-18 DIAGNOSIS — Z08 Encounter for follow-up examination after completed treatment for malignant neoplasm: Secondary | ICD-10-CM | POA: Diagnosis not present

## 2021-11-18 DIAGNOSIS — Z85038 Personal history of other malignant neoplasm of large intestine: Secondary | ICD-10-CM | POA: Diagnosis not present

## 2021-11-18 DIAGNOSIS — Z980 Intestinal bypass and anastomosis status: Secondary | ICD-10-CM | POA: Diagnosis not present

## 2021-11-27 DIAGNOSIS — M5416 Radiculopathy, lumbar region: Secondary | ICD-10-CM | POA: Diagnosis not present

## 2021-11-30 DIAGNOSIS — D225 Melanocytic nevi of trunk: Secondary | ICD-10-CM | POA: Diagnosis not present

## 2021-11-30 DIAGNOSIS — M5416 Radiculopathy, lumbar region: Secondary | ICD-10-CM | POA: Diagnosis not present

## 2021-11-30 DIAGNOSIS — D2272 Melanocytic nevi of left lower limb, including hip: Secondary | ICD-10-CM | POA: Diagnosis not present

## 2021-11-30 DIAGNOSIS — Z85828 Personal history of other malignant neoplasm of skin: Secondary | ICD-10-CM | POA: Diagnosis not present

## 2021-11-30 DIAGNOSIS — D2261 Melanocytic nevi of right upper limb, including shoulder: Secondary | ICD-10-CM | POA: Diagnosis not present

## 2021-11-30 DIAGNOSIS — D2362 Other benign neoplasm of skin of left upper limb, including shoulder: Secondary | ICD-10-CM | POA: Diagnosis not present

## 2021-11-30 DIAGNOSIS — D1801 Hemangioma of skin and subcutaneous tissue: Secondary | ICD-10-CM | POA: Diagnosis not present

## 2021-11-30 DIAGNOSIS — L918 Other hypertrophic disorders of the skin: Secondary | ICD-10-CM | POA: Diagnosis not present

## 2021-11-30 DIAGNOSIS — D2262 Melanocytic nevi of left upper limb, including shoulder: Secondary | ICD-10-CM | POA: Diagnosis not present

## 2021-11-30 DIAGNOSIS — L720 Epidermal cyst: Secondary | ICD-10-CM | POA: Diagnosis not present

## 2021-11-30 DIAGNOSIS — D2271 Melanocytic nevi of right lower limb, including hip: Secondary | ICD-10-CM | POA: Diagnosis not present

## 2021-12-03 DIAGNOSIS — M5416 Radiculopathy, lumbar region: Secondary | ICD-10-CM | POA: Diagnosis not present

## 2021-12-08 DIAGNOSIS — M5416 Radiculopathy, lumbar region: Secondary | ICD-10-CM | POA: Diagnosis not present

## 2021-12-10 DIAGNOSIS — M5416 Radiculopathy, lumbar region: Secondary | ICD-10-CM | POA: Diagnosis not present

## 2021-12-14 ENCOUNTER — Encounter (INDEPENDENT_AMBULATORY_CARE_PROVIDER_SITE_OTHER): Payer: Medicare Other | Admitting: Ophthalmology

## 2021-12-14 DIAGNOSIS — I10 Essential (primary) hypertension: Secondary | ICD-10-CM | POA: Diagnosis not present

## 2021-12-14 DIAGNOSIS — H353211 Exudative age-related macular degeneration, right eye, with active choroidal neovascularization: Secondary | ICD-10-CM

## 2021-12-14 DIAGNOSIS — H35033 Hypertensive retinopathy, bilateral: Secondary | ICD-10-CM

## 2021-12-14 DIAGNOSIS — H353122 Nonexudative age-related macular degeneration, left eye, intermediate dry stage: Secondary | ICD-10-CM

## 2021-12-14 DIAGNOSIS — H30899 Other chorioretinal inflammations, unspecified eye: Secondary | ICD-10-CM | POA: Diagnosis not present

## 2021-12-14 DIAGNOSIS — H43813 Vitreous degeneration, bilateral: Secondary | ICD-10-CM

## 2021-12-15 DIAGNOSIS — M5416 Radiculopathy, lumbar region: Secondary | ICD-10-CM | POA: Diagnosis not present

## 2021-12-17 DIAGNOSIS — M5416 Radiculopathy, lumbar region: Secondary | ICD-10-CM | POA: Diagnosis not present

## 2021-12-24 DIAGNOSIS — M5416 Radiculopathy, lumbar region: Secondary | ICD-10-CM | POA: Diagnosis not present

## 2021-12-28 DIAGNOSIS — M5416 Radiculopathy, lumbar region: Secondary | ICD-10-CM | POA: Diagnosis not present

## 2021-12-28 DIAGNOSIS — H40013 Open angle with borderline findings, low risk, bilateral: Secondary | ICD-10-CM | POA: Diagnosis not present

## 2022-01-01 DIAGNOSIS — M545 Low back pain, unspecified: Secondary | ICD-10-CM | POA: Diagnosis not present

## 2022-01-07 DIAGNOSIS — M5416 Radiculopathy, lumbar region: Secondary | ICD-10-CM | POA: Diagnosis not present

## 2022-01-12 DIAGNOSIS — M5416 Radiculopathy, lumbar region: Secondary | ICD-10-CM | POA: Diagnosis not present

## 2022-01-15 DIAGNOSIS — M5416 Radiculopathy, lumbar region: Secondary | ICD-10-CM | POA: Diagnosis not present

## 2022-01-18 ENCOUNTER — Encounter (INDEPENDENT_AMBULATORY_CARE_PROVIDER_SITE_OTHER): Payer: Medicare Other | Admitting: Ophthalmology

## 2022-01-18 DIAGNOSIS — H43813 Vitreous degeneration, bilateral: Secondary | ICD-10-CM

## 2022-01-18 DIAGNOSIS — H353211 Exudative age-related macular degeneration, right eye, with active choroidal neovascularization: Secondary | ICD-10-CM | POA: Diagnosis not present

## 2022-01-18 DIAGNOSIS — H353122 Nonexudative age-related macular degeneration, left eye, intermediate dry stage: Secondary | ICD-10-CM

## 2022-01-18 DIAGNOSIS — I10 Essential (primary) hypertension: Secondary | ICD-10-CM | POA: Diagnosis not present

## 2022-01-18 DIAGNOSIS — H35033 Hypertensive retinopathy, bilateral: Secondary | ICD-10-CM | POA: Diagnosis not present

## 2022-01-25 DIAGNOSIS — M5416 Radiculopathy, lumbar region: Secondary | ICD-10-CM | POA: Diagnosis not present

## 2022-01-29 DIAGNOSIS — M5416 Radiculopathy, lumbar region: Secondary | ICD-10-CM | POA: Diagnosis not present

## 2022-02-02 DIAGNOSIS — M5416 Radiculopathy, lumbar region: Secondary | ICD-10-CM | POA: Diagnosis not present

## 2022-02-04 DIAGNOSIS — M5416 Radiculopathy, lumbar region: Secondary | ICD-10-CM | POA: Diagnosis not present

## 2022-02-08 DIAGNOSIS — M5416 Radiculopathy, lumbar region: Secondary | ICD-10-CM | POA: Diagnosis not present

## 2022-02-09 ENCOUNTER — Encounter: Payer: Self-pay | Admitting: Internal Medicine

## 2022-02-09 ENCOUNTER — Ambulatory Visit (INDEPENDENT_AMBULATORY_CARE_PROVIDER_SITE_OTHER): Payer: Medicare Other | Admitting: Internal Medicine

## 2022-02-09 DIAGNOSIS — I10 Essential (primary) hypertension: Secondary | ICD-10-CM

## 2022-02-09 DIAGNOSIS — F5104 Psychophysiologic insomnia: Secondary | ICD-10-CM

## 2022-02-09 DIAGNOSIS — Z85038 Personal history of other malignant neoplasm of large intestine: Secondary | ICD-10-CM | POA: Diagnosis not present

## 2022-02-09 DIAGNOSIS — C189 Malignant neoplasm of colon, unspecified: Secondary | ICD-10-CM | POA: Diagnosis not present

## 2022-02-09 DIAGNOSIS — J4520 Mild intermittent asthma, uncomplicated: Secondary | ICD-10-CM | POA: Diagnosis not present

## 2022-02-09 DIAGNOSIS — K219 Gastro-esophageal reflux disease without esophagitis: Secondary | ICD-10-CM | POA: Diagnosis not present

## 2022-02-09 MED ORDER — BUPROPION HCL ER (XL) 300 MG PO TB24: 300.0000 mg | ORAL_TABLET | Freq: Every day | ORAL | 3 refills | Status: DC

## 2022-02-09 MED ORDER — ZALEPLON 5 MG PO CAPS: 5.0000 mg | ORAL_CAPSULE | Freq: Every evening | ORAL | 1 refills | Status: DC | PRN

## 2022-02-09 NOTE — Progress Notes (Signed)
   Subjective:   Patient ID: Erica [name], female    DOB: 1950/12/06, 71 y.o.   MRN: [id number]  HPI The patient is a new 71 YO female coming in for sleep issues and caregiver stress  PMH, [place], social history reviewed and updated  Review of Systems  Constitutional: Negative.   HENT: Negative.    Eyes: Negative.   Respiratory:  Negative for cough, chest tightness and shortness of breath.   Cardiovascular:  Negative for chest pain, palpitations and leg swelling.  Gastrointestinal:  Negative for abdominal distention, abdominal pain, constipation, diarrhea, nausea and vomiting.  Musculoskeletal: Negative.   Skin: Negative.   Neurological: Negative.   Psychiatric/Behavioral:  Positive for sleep disturbance. The patient is nervous/anxious.     Objective:  Physical Exam Constitutional:      Appearance: She is well-developed.  HENT:     Head: Normocephalic and atraumatic.  Cardiovascular:     Rate and Rhythm: Normal rate and regular rhythm.  Pulmonary:     Effort: Pulmonary effort is normal. No respiratory distress.     Breath sounds: Normal breath sounds. No wheezing or rales.  Abdominal:     General: Bowel sounds are normal. There is no distension.     Palpations: Abdomen is soft.     Tenderness: There is no abdominal tenderness. There is no rebound.  Musculoskeletal:     Cervical back: Normal range of motion.  Skin:    General: Skin is warm and dry.  Neurological:     Mental Status: She is alert and oriented to person, place, and time.     Coordination: Coordination normal.     Vitals:   02/09/22 1001  BP: 132/78  Pulse: 86  SpO2: 99%  Weight: 155 lb (70.3 kg)  Height: '4\' 11"'$  (1.499 m)    Assessment & Plan:

## 2022-02-09 NOTE — Patient Instructions (Addendum)
We will try sonata for sleep to use.  We have sent in the wellbutrin refill and if you need a refill on anything let us know on mychart instead of contacting the pharmacy as that will be easier.

## 2022-02-09 NOTE — Assessment & Plan Note (Addendum)
Hx colon cancer 2009 s/p resection. Is due for screening colonoscopy which is upcoming.

## 2022-02-10 ENCOUNTER — Other Ambulatory Visit: Payer: Self-pay | Admitting: *Deleted

## 2022-02-10 MED ORDER — ZALEPLON 5 MG PO CAPS: 5.0000 mg | ORAL_CAPSULE | Freq: Every evening | ORAL | 1 refills | Status: DC | PRN

## 2022-02-10 NOTE — Telephone Encounter (Signed)
[facility] [address]

## 2022-02-13 DIAGNOSIS — K219 Gastro-esophageal reflux disease without esophagitis: Secondary | ICD-10-CM | POA: Insufficient documentation

## 2022-02-13 DIAGNOSIS — J45909 Unspecified asthma, uncomplicated: Secondary | ICD-10-CM | POA: Insufficient documentation

## 2022-02-13 DIAGNOSIS — G4700 Insomnia, unspecified: Secondary | ICD-10-CM | POA: Insufficient documentation

## 2022-02-13 DIAGNOSIS — I10 Essential (primary) hypertension: Secondary | ICD-10-CM | POA: Insufficient documentation

## 2022-02-13 NOTE — Assessment & Plan Note (Signed)
Taking amlodipine/valsartan 5/160 and BP at goal. Getting records and labs due early 2024. Continue for now and can refill if needed.

## 2022-02-13 NOTE — Assessment & Plan Note (Signed)
Rx sonata 5 mg qhs prn to help with sleep. She has tried temazepam and trazodone which both had side effects of hangover effect.

## 2022-02-13 NOTE — Assessment & Plan Note (Signed)
Taking omeprazole 20 mg BID and well controlled. Continue and can refill if needed.

## 2022-02-13 NOTE — Assessment & Plan Note (Signed)
Uses flovent and flonase and albuterol prn for bad colds and bad allergies. No flare today. Can refill as needed.

## 2022-02-17 DIAGNOSIS — M5416 Radiculopathy, lumbar region: Secondary | ICD-10-CM | POA: Diagnosis not present

## 2022-02-19 DIAGNOSIS — M5416 Radiculopathy, lumbar region: Secondary | ICD-10-CM | POA: Diagnosis not present

## 2022-02-22 ENCOUNTER — Encounter (INDEPENDENT_AMBULATORY_CARE_PROVIDER_SITE_OTHER): Payer: Medicare Other | Admitting: Ophthalmology

## 2022-02-22 DIAGNOSIS — H353211 Exudative age-related macular degeneration, right eye, with active choroidal neovascularization: Secondary | ICD-10-CM

## 2022-02-22 DIAGNOSIS — H35033 Hypertensive retinopathy, bilateral: Secondary | ICD-10-CM

## 2022-02-22 DIAGNOSIS — H43813 Vitreous degeneration, bilateral: Secondary | ICD-10-CM | POA: Diagnosis not present

## 2022-02-22 DIAGNOSIS — I10 Essential (primary) hypertension: Secondary | ICD-10-CM | POA: Diagnosis not present

## 2022-02-22 DIAGNOSIS — H353122 Nonexudative age-related macular degeneration, left eye, intermediate dry stage: Secondary | ICD-10-CM

## 2022-02-24 DIAGNOSIS — Z23 Encounter for immunization: Secondary | ICD-10-CM | POA: Diagnosis not present

## 2022-02-26 DIAGNOSIS — M545 Low back pain, unspecified: Secondary | ICD-10-CM | POA: Diagnosis not present

## 2022-02-26 DIAGNOSIS — M5416 Radiculopathy, lumbar region: Secondary | ICD-10-CM | POA: Diagnosis not present

## 2022-02-27 DIAGNOSIS — Z23 Encounter for immunization: Secondary | ICD-10-CM | POA: Diagnosis not present

## 2022-03-01 DIAGNOSIS — M5416 Radiculopathy, lumbar region: Secondary | ICD-10-CM | POA: Diagnosis not present

## 2022-03-15 DIAGNOSIS — H353122 Nonexudative age-related macular degeneration, left eye, intermediate dry stage: Secondary | ICD-10-CM | POA: Diagnosis not present

## 2022-03-15 DIAGNOSIS — H25013 Cortical age-related cataract, bilateral: Secondary | ICD-10-CM | POA: Diagnosis not present

## 2022-03-15 DIAGNOSIS — H52213 Irregular astigmatism, bilateral: Secondary | ICD-10-CM | POA: Diagnosis not present

## 2022-03-15 DIAGNOSIS — H2513 Age-related nuclear cataract, bilateral: Secondary | ICD-10-CM | POA: Diagnosis not present

## 2022-03-15 DIAGNOSIS — H353211 Exudative age-related macular degeneration, right eye, with active choroidal neovascularization: Secondary | ICD-10-CM | POA: Diagnosis not present

## 2022-03-15 DIAGNOSIS — H2511 Age-related nuclear cataract, right eye: Secondary | ICD-10-CM | POA: Diagnosis not present

## 2022-03-15 DIAGNOSIS — H40013 Open angle with borderline findings, low risk, bilateral: Secondary | ICD-10-CM | POA: Diagnosis not present

## 2022-03-15 LAB — HM DIABETES EYE EXAM

## 2022-03-23 ENCOUNTER — Other Ambulatory Visit: Payer: Self-pay | Admitting: Neurological Surgery

## 2022-03-23 DIAGNOSIS — M5416 Radiculopathy, lumbar region: Secondary | ICD-10-CM

## 2022-03-23 DIAGNOSIS — Z6831 Body mass index (BMI) 31.0-31.9, adult: Secondary | ICD-10-CM | POA: Diagnosis not present

## 2022-03-25 DIAGNOSIS — Z6831 Body mass index (BMI) 31.0-31.9, adult: Secondary | ICD-10-CM | POA: Diagnosis not present

## 2022-03-25 DIAGNOSIS — Z1231 Encounter for screening mammogram for malignant neoplasm of breast: Secondary | ICD-10-CM | POA: Diagnosis not present

## 2022-03-25 DIAGNOSIS — Z01419 Encounter for gynecological examination (general) (routine) without abnormal findings: Secondary | ICD-10-CM | POA: Diagnosis not present

## 2022-03-25 DIAGNOSIS — N390 Urinary tract infection, site not specified: Secondary | ICD-10-CM | POA: Diagnosis not present

## 2022-03-29 ENCOUNTER — Encounter (INDEPENDENT_AMBULATORY_CARE_PROVIDER_SITE_OTHER): Payer: Medicare Other | Admitting: Ophthalmology

## 2022-03-29 DIAGNOSIS — H353122 Nonexudative age-related macular degeneration, left eye, intermediate dry stage: Secondary | ICD-10-CM

## 2022-03-29 DIAGNOSIS — H35033 Hypertensive retinopathy, bilateral: Secondary | ICD-10-CM

## 2022-03-29 DIAGNOSIS — H353211 Exudative age-related macular degeneration, right eye, with active choroidal neovascularization: Secondary | ICD-10-CM

## 2022-03-29 DIAGNOSIS — I10 Essential (primary) hypertension: Secondary | ICD-10-CM | POA: Diagnosis not present

## 2022-03-29 DIAGNOSIS — H43813 Vitreous degeneration, bilateral: Secondary | ICD-10-CM

## 2022-04-11 ENCOUNTER — Ambulatory Visit
Admission: RE | Admit: 2022-04-11 | Discharge: 2022-04-11 | Disposition: A | Discharge status: Home / Self Care | Payer: Medicare Other | Source: Ambulatory Visit | Attending: Neurological Surgery | Admitting: Neurological Surgery

## 2022-04-11 DIAGNOSIS — M545 Low back pain, unspecified: Secondary | ICD-10-CM | POA: Diagnosis not present

## 2022-04-11 DIAGNOSIS — M5416 Radiculopathy, lumbar region: Secondary | ICD-10-CM

## 2022-04-15 DIAGNOSIS — M5416 Radiculopathy, lumbar region: Secondary | ICD-10-CM | POA: Diagnosis not present

## 2022-04-26 ENCOUNTER — Encounter (INDEPENDENT_AMBULATORY_CARE_PROVIDER_SITE_OTHER): Payer: Medicare Other | Admitting: Ophthalmology

## 2022-04-26 DIAGNOSIS — H43813 Vitreous degeneration, bilateral: Secondary | ICD-10-CM | POA: Diagnosis not present

## 2022-04-26 DIAGNOSIS — I10 Essential (primary) hypertension: Secondary | ICD-10-CM

## 2022-04-26 DIAGNOSIS — H353211 Exudative age-related macular degeneration, right eye, with active choroidal neovascularization: Secondary | ICD-10-CM | POA: Diagnosis not present

## 2022-04-26 DIAGNOSIS — H35033 Hypertensive retinopathy, bilateral: Secondary | ICD-10-CM

## 2022-04-26 DIAGNOSIS — H353122 Nonexudative age-related macular degeneration, left eye, intermediate dry stage: Secondary | ICD-10-CM | POA: Diagnosis not present

## 2022-05-04 DIAGNOSIS — H25811 Combined forms of age-related cataract, right eye: Secondary | ICD-10-CM | POA: Diagnosis not present

## 2022-05-04 DIAGNOSIS — H2511 Age-related nuclear cataract, right eye: Secondary | ICD-10-CM | POA: Diagnosis not present

## 2022-05-05 ENCOUNTER — Telehealth: Payer: Self-pay | Admitting: Internal Medicine

## 2022-05-05 DIAGNOSIS — I10 Essential (primary) hypertension: Secondary | ICD-10-CM

## 2022-05-05 DIAGNOSIS — Z85038 Personal history of other malignant neoplasm of large intestine: Secondary | ICD-10-CM

## 2022-05-05 NOTE — Telephone Encounter (Signed)
Pt has CPE scheduled 1/9, lab appt scheduled 1/5.  Please enter orders for the lab visit.

## 2022-05-07 NOTE — Telephone Encounter (Signed)
Spoke with patient and she stated that for the most part her insurance covers everything including a physical.  She stated that have [place] [employer].

## 2022-05-07 NOTE — Telephone Encounter (Signed)
Labs ordered. For just medicare a physical is not covered but can have follow up visit.

## 2022-05-14 ENCOUNTER — Other Ambulatory Visit (INDEPENDENT_AMBULATORY_CARE_PROVIDER_SITE_OTHER): Payer: Medicare Other

## 2022-05-14 DIAGNOSIS — Z85038 Personal history of other malignant neoplasm of large intestine: Secondary | ICD-10-CM | POA: Diagnosis not present

## 2022-05-14 DIAGNOSIS — I10 Essential (primary) hypertension: Secondary | ICD-10-CM

## 2022-05-14 LAB — CBC
HCT: 43.2 % (ref 36.0–46.0)
Hemoglobin: 14.5 g/dL (ref 12.0–15.0)
MCHC: 33.5 g/dL (ref 30.0–36.0)
MCV: 89.3 fl (ref 78.0–100.0)
Platelets: 302 10*3/uL (ref 150.0–400.0)
RBC: 4.83 Mil/uL (ref 3.87–5.11)
RDW: 13.2 % (ref 11.5–15.5)
WBC: 6.5 10*3/uL (ref 4.0–10.5)

## 2022-05-14 LAB — COMPREHENSIVE METABOLIC PANEL
ALT: 27 U/L (ref 0–35)
AST: 25 U/L (ref 0–37)
Albumin: 4.4 g/dL (ref 3.5–5.2)
Alkaline Phosphatase: 78 U/L (ref 39–117)
BUN: 22 mg/dL (ref 6–23)
CO2: 27 mEq/L (ref 19–32)
Calcium: 9.7 mg/dL (ref 8.4–10.5)
Chloride: 103 mEq/L (ref 96–112)
Creatinine, Ser: 0.56 mg/dL (ref 0.40–1.20)
GFR: 93.29 mL/min (ref >60.00)
Glucose, Bld: 88 mg/dL (ref 70–99)
Potassium: 3.8 mEq/L (ref 3.5–5.1)
Sodium: 138 mEq/L (ref 135–145)
Total Bilirubin: 0.5 mg/dL (ref 0.2–1.2)
Total Protein: 7.2 g/dL (ref 6.0–8.3)

## 2022-05-14 LAB — LIPID PANEL
Cholesterol: 222 mg/dL — ABNORMAL HIGH (ref 0–200)
HDL: 72.6 mg/dL (ref >39.00)
LDL Cholesterol: 127 mg/dL — ABNORMAL HIGH (ref 0–99)
NonHDL: 149.86
Total CHOL/HDL Ratio: 3
Triglycerides: 116 mg/dL (ref 0.0–149.0)
VLDL: 23.2 mg/dL (ref 0.0–40.0)

## 2022-05-15 LAB — CEA: CEA: <2 ng/mL

## 2022-05-18 ENCOUNTER — Ambulatory Visit (INDEPENDENT_AMBULATORY_CARE_PROVIDER_SITE_OTHER): Payer: Medicare Other | Admitting: Internal Medicine

## 2022-05-18 ENCOUNTER — Ambulatory Visit (INDEPENDENT_AMBULATORY_CARE_PROVIDER_SITE_OTHER): Payer: Medicare Other

## 2022-05-18 ENCOUNTER — Encounter: Payer: Self-pay | Admitting: Internal Medicine

## 2022-05-18 VITALS — BP 120/80 | HR 102 | Temp 98.2°F | Ht 59.0 in | Wt 159.0 lb

## 2022-05-18 DIAGNOSIS — F5104 Psychophysiologic insomnia: Secondary | ICD-10-CM | POA: Diagnosis not present

## 2022-05-18 DIAGNOSIS — M25551 Pain in right hip: Secondary | ICD-10-CM

## 2022-05-18 DIAGNOSIS — M25552 Pain in left hip: Secondary | ICD-10-CM

## 2022-05-18 DIAGNOSIS — H353131 Nonexudative age-related macular degeneration, bilateral, early dry stage: Secondary | ICD-10-CM

## 2022-05-18 DIAGNOSIS — F4323 Adjustment disorder with mixed anxiety and depressed mood: Secondary | ICD-10-CM

## 2022-05-18 DIAGNOSIS — F4320 Adjustment disorder, unspecified: Secondary | ICD-10-CM | POA: Insufficient documentation

## 2022-05-18 DIAGNOSIS — Z0001 Encounter for general adult medical examination with abnormal findings: Secondary | ICD-10-CM | POA: Diagnosis not present

## 2022-05-18 DIAGNOSIS — Z85038 Personal history of other malignant neoplasm of large intestine: Secondary | ICD-10-CM | POA: Diagnosis not present

## 2022-05-18 DIAGNOSIS — I10 Essential (primary) hypertension: Secondary | ICD-10-CM

## 2022-05-18 DIAGNOSIS — K219 Gastro-esophageal reflux disease without esophagitis: Secondary | ICD-10-CM

## 2022-05-18 DIAGNOSIS — J4520 Mild intermittent asthma, uncomplicated: Secondary | ICD-10-CM | POA: Diagnosis not present

## 2022-05-18 DIAGNOSIS — Z23 Encounter for immunization: Secondary | ICD-10-CM

## 2022-05-18 MED ORDER — ESZOPICLONE 1 MG PO TABS: 1.0000 mg | ORAL_TABLET | Freq: Every evening | ORAL | 0 refills | Status: DC | PRN

## 2022-05-18 MED ORDER — OMEPRAZOLE 20 MG PO CPDR: 20.0000 mg | DELAYED_RELEASE_CAPSULE | Freq: Two times a day (BID) | ORAL | 3 refills | Status: DC

## 2022-05-18 MED ORDER — AMLODIPINE BESYLATE-VALSARTAN 5-160 MG PO TABS: 1.0000 | ORAL_TABLET | Freq: Every day | ORAL | 3 refills | Status: DC

## 2022-05-18 MED ORDER — ALPRAZOLAM 0.25 MG PO TABS: 0.2500 mg | ORAL_TABLET | Freq: Two times a day (BID) | ORAL | 1 refills | Status: DC | PRN

## 2022-05-18 NOTE — Patient Instructions (Signed)
We will check the x-ray of the hips today.

## 2022-05-18 NOTE — Assessment & Plan Note (Signed)
Due for screening which is upcoming.

## 2022-05-18 NOTE — Assessment & Plan Note (Signed)
Seeing optho every 5 weeks and stable overall.

## 2022-05-18 NOTE — Assessment & Plan Note (Signed)
BP at goal on amlodipine/valsartan 5/160 mg daily. Refilled for 1 year worth and reviewed recent labs at visit. Continue.

## 2022-05-18 NOTE — Assessment & Plan Note (Signed)
Sonata did not help. Will try lunesta 1 mg qhs prn. She has tried and failed sonata, temazepam and trazodone.

## 2022-05-18 NOTE — Assessment & Plan Note (Signed)
Having pain in both hips and radiates into middle leg. Has had extensive workup including MRI low back and PT and injections without relief and was told by Dr. Ronnald Ramp it is not her back. No imaging done of the hips. Ordered x-ray hips today.

## 2022-05-18 NOTE — Assessment & Plan Note (Signed)
Flu shot up to date. Covid-19 up to date. Pneumonia given 20 to complete series. Shingrix complete. Tetanus due 2027. Colonoscopy due scheduled. Mammogram up to date due 2025, pap smear aged out and dexa complete. Counseled about sun safety and mole surveillance. Counseled about the dangers of distracted driving. Given 10 year screening recommendations.

## 2022-05-18 NOTE — Assessment & Plan Note (Signed)
Overall stable and using albuterol prn and flovent as needed.

## 2022-05-18 NOTE — Assessment & Plan Note (Signed)
Taking wellbutrin 300 mg daily and refilled alprazolam 0.25 mg daily to use rarely. She uses less than twice a week. She is struggling with husband's alzheimer's diagnosis. They are also planning move to friend's home papers filled out and given to her today for this.

## 2022-05-18 NOTE — Progress Notes (Signed)
Subjective:   Patient ID: Erica [name], female    DOB: 1951-04-18, 72 y.o.   MRN: [id number]  HPI Here for medicare wellness, ongoing complaints. Please see A/P for status and treatment of chronic medical problems.   Diet: heart healthy Physical activity: sedentary Depression/mood screen: see a/p, adjustment disorder Hearing: intact to whispered voice Visual acuity: s/p right cataract getting left done soon, performs annual eye exam  ADLs: capable Fall risk: none Home safety: good Cognitive evaluation: intact to orientation, naming, recall and repetition EOL planning: adv directives discussed, in place  [employer] Visit from 05/18/2022 in [place] at [facility] Visit from 05/18/2022 in [place] at [facility]  PHQ-9 Total Score 0         02/09/2022   10:10 AM 02/10/2022    9:54 AM 05/18/2022   10:08 AM  [place] in the past year? 0 0 0  Was there an injury with Fall? 0 0 0  Fall Risk Category Calculator 0 0 0  Fall Risk Category Low Low Low  Fall risk Follow up   Falls evaluation completed   I have personally reviewed and have noted 1. The patient's medical and social history - reviewed today no changes 2. Their use of alcohol, tobacco or illicit drugs 3. Their current medications and supplements 4. The patient's functional ability including ADL's, fall risks, home safety risks and hearing or visual impairment. 5. Diet and physical activities 6. Evidence for depression or mood disorders 7. Care team reviewed and updated 8.  The patient is not on an opioid pain medication.  Patient Care Team: Hoyt Koch, MD as PCP - General (Internal Medicine) History reviewed. No pertinent past medical history. History reviewed. No pertinent surgical history. Family History  Problem Relation Age of Onset   Anesthesia problems Brother    Review of Systems  Constitutional: Negative.    HENT: Negative.    Eyes: Negative.   Respiratory:  Negative for cough, chest tightness and shortness of breath.   Cardiovascular:  Negative for chest pain, palpitations and leg swelling.  Gastrointestinal:  Negative for abdominal distention, abdominal pain, constipation, diarrhea, nausea and vomiting.  Musculoskeletal: Negative.   Skin: Negative.   Neurological: Negative.   Psychiatric/Behavioral:  Positive for dysphoric mood and sleep disturbance. The patient is nervous/anxious.     Objective:  Physical Exam Constitutional:      Appearance: She is well-developed.  HENT:     Head: Normocephalic and atraumatic.  Cardiovascular:     Rate and Rhythm: Normal rate and regular rhythm.  Pulmonary:     Effort: Pulmonary effort is normal. No respiratory distress.     Breath sounds: Normal breath sounds. No wheezing or rales.  Abdominal:     General: Bowel sounds are normal. There is no distension.     Palpations: Abdomen is soft.     Tenderness: There is no abdominal tenderness. There is no rebound.  Musculoskeletal:     Cervical back: Normal range of motion.  Skin:    General: Skin is warm and dry.  Neurological:     Mental Status: She is alert and oriented to person, place, and time.     Coordination: Coordination normal.     Vitals:   05/18/22 0958  BP: 120/80  Pulse: (!) 102  Temp: 98.2 F (36.8 C)  TempSrc: Oral  SpO2: 99%  Weight: 159 lb (  72.1 kg)  Height: '4\' 11"'$  (1.499 m)    Assessment & Plan:  Prevnar 20 given at visit

## 2022-05-19 ENCOUNTER — Telehealth: Payer: Self-pay

## 2022-05-19 ENCOUNTER — Other Ambulatory Visit: Payer: Self-pay | Admitting: Internal Medicine

## 2022-05-19 DIAGNOSIS — M25551 Pain in right hip: Secondary | ICD-10-CM

## 2022-05-19 NOTE — Telephone Encounter (Signed)
Left voicemail for patient letting her know about receiving a phone call from our sports and medicine office to schedule an appointment with them and that she does not have to see her cardiologist and to also do a 6 month follow up.

## 2022-05-21 ENCOUNTER — Telehealth: Payer: Self-pay | Admitting: Internal Medicine

## 2022-05-21 MED ORDER — ALPRAZOLAM 0.25 MG PO TABS: 0.2500 mg | ORAL_TABLET | Freq: Two times a day (BID) | ORAL | 1 refills | Status: DC | PRN

## 2022-05-21 MED ORDER — ESZOPICLONE 1 MG PO TABS: 1.0000 mg | ORAL_TABLET | Freq: Every evening | ORAL | 0 refills | Status: DC | PRN

## 2022-05-21 NOTE — Telephone Encounter (Signed)
I was able to inform the pt of [facility]. She stated she understood and gave thanks.

## 2022-05-21 NOTE — Telephone Encounter (Signed)
Pt is requesting that her RX: eszopiclone (LUNESTA) 1 MG TABS tablet  & ALPRAZolam (XANAX) 0.25 MG tablet   [id number]  Phone: [phone] Fax: [phone]

## 2022-05-21 NOTE — Telephone Encounter (Signed)
Sent in

## 2022-05-24 DIAGNOSIS — H2512 Age-related nuclear cataract, left eye: Secondary | ICD-10-CM | POA: Diagnosis not present

## 2022-05-24 DIAGNOSIS — H25012 Cortical age-related cataract, left eye: Secondary | ICD-10-CM | POA: Diagnosis not present

## 2022-05-25 NOTE — Progress Notes (Signed)
Erica [name] D.[place] Erica [name] Phone: [phone]   Assessment and Plan:     1. Chronic bilateral low back pain with right-sided sciatica 2. Lumbar radiculopathy 3. Arthralgia of right lower leg -Chronic with exacerbation, initial sports medicine visit - Unclear etiology of low back pain with radiation into bilateral hips and right leg pain distal to right knee - Most likely diagnosis is lumbar DDD and spondylosis producing low back pain and right-sided radicular symptoms, though it is unusual that patient feels a gap in pain that ends in hip and restarts distal to knee rather than 1 continuous pain - Patient did have relief, though temporary (4 to 8 weeks) after lumbar epidural in the past with last reported in 01/2022, though we do not have reports of these procedures in EMR.  This would also support lumbar etiology of patient's symptoms.  Patient has trialed gabapentin 300 mg, evaluation of right knee, physical therapy, without significant benefit - Start meloxicam 15 mg daily x2 weeks.  If still having pain after 2 weeks, complete 3rd-week of meloxicam. May use remaining meloxicam as needed once daily for pain control.  Do not to use additional NSAIDs while taking meloxicam.  May use Tylenol [phone] mg 2 to 3 times a day for breakthrough pain. - Start HEP  Other orders - meloxicam (MOBIC) 15 MG tablet; Take 1 tablet (15 mg total) by mouth daily.    Pertinent previous records reviewed include bilateral hip x-ray 05/18/2022, lumbar spine MRI 04/11/2022, primary care note 05/18/2022   Follow Up: 4 weeks for reevaluation.  Could repeat physical exam to see if NSAID course has changed patient's pain patterns   Subjective:   I, Erica [name], am serving as a Education administrator for Doctor Glennon Mac  Chief Complaint: left hip pain and right knee    HPI:   05/26/2022 Patient is a 72 year old female complaining of hip pain.  Patient states a few years back she has a hx of herniated disc in her back has tried gabapentin, was referred to pain management , had epidural and that helped for a little about 8 weeks, has completed PT, right knee pain has pain when walking ,hip pain radiates around to the groin , Advil and ib helps , no numbness and tingling, is not able to do her gardening    Relevant Historical Information: History of colon cancer, GERD  Additional pertinent review of systems negative.   Current Outpatient Medications:    ALBUTEROL SULFATE HFA IN, Inhale into the lungs every 6 (six) hours as needed., Disp: , Rfl:    ALPRAZolam (XANAX) 0.25 MG tablet, Take 1 tablet (0.25 mg total) by mouth 2 (two) times daily as needed for anxiety., Disp: 30 tablet, Rfl: 1   amLODipine-valsartan (EXFORGE) 5-160 MG tablet, Take 1 tablet by mouth daily., Disp: 90 tablet, Rfl: 3   buPROPion (WELLBUTRIN XL) 300 MG 24 hr tablet, Take 1 tablet (300 mg total) by mouth daily., Disp: 90 tablet, Rfl: 3   cetirizine (ZYRTEC) 10 MG tablet, Take 10 mg by mouth daily., Disp: , Rfl:    eszopiclone (LUNESTA) 1 MG TABS tablet, Take 1 tablet (1 mg total) by mouth at bedtime as needed for sleep. Take immediately before bedtime, Disp: 15 tablet, Rfl: 0   fluticasone (VERAMYST) 27.5 MCG/SPRAY nasal spray, Place 2 sprays into the nose daily as needed for rhinitis., Disp: , Rfl:    Fluticasone Propionate HFA (FLOVENT HFA  IN), Inhale into the lungs., Disp: , Rfl:    meloxicam (MOBIC) 15 MG tablet, Take 1 tablet (15 mg total) by mouth daily., Disp: 30 tablet, Rfl: 0   omeprazole (PRILOSEC) 20 MG capsule, Take 1 capsule (20 mg total) by mouth 2 (two) times daily., Disp: 90 capsule, Rfl: 3   Objective:     Vitals:   05/26/22 0943  Pulse: 89  SpO2: 100%  Weight: 159 lb (72.1 kg)  Height: '4\' 11"'$  (1.499 m)      Body mass index is 32.11 kg/m.    Physical Exam:    Gen: Appears well, nad, nontoxic and pleasant Psych: Alert and oriented,  appropriate mood and affect Neuro: sensation intact, strength is 5/5 in upper and lower extremities, muscle tone wnl Skin: no susupicious lesions or rashes  Back - Normal skin, Spine with normal alignment and no deformity.   No tenderness to vertebral process palpation.   Paraspinous muscles are mildly tender and without spasm NTTP gluteal musculature Straight leg raise positive for radicular symptoms distal to right knee Trendelenberg negative Piriformis Test positive for radicular symptoms and hip and distal to right knee   Electronically signed by:  Erica [name] D.[facility] 10:43 AM 05/26/22

## 2022-05-26 ENCOUNTER — Ambulatory Visit (INDEPENDENT_AMBULATORY_CARE_PROVIDER_SITE_OTHER): Payer: Medicare Other | Admitting: Sports Medicine

## 2022-05-26 VITALS — HR 89 | Ht 59.0 in | Wt 159.0 lb

## 2022-05-26 DIAGNOSIS — G8929 Other chronic pain: Secondary | ICD-10-CM

## 2022-05-26 DIAGNOSIS — M5441 Lumbago with sciatica, right side: Secondary | ICD-10-CM | POA: Diagnosis not present

## 2022-05-26 DIAGNOSIS — M25561 Pain in right knee: Secondary | ICD-10-CM

## 2022-05-26 DIAGNOSIS — M5416 Radiculopathy, lumbar region: Secondary | ICD-10-CM

## 2022-05-26 MED ORDER — MELOXICAM 15 MG PO TABS: 15.0000 mg | ORAL_TABLET | Freq: Every day | ORAL | 0 refills | Status: DC

## 2022-05-26 NOTE — Patient Instructions (Signed)
-  Start meloxicam 15 mg daily x2 weeks.  If still having pain after 2 weeks, complete 3rd-week of meloxicam. May use remaining meloxicam as needed once daily for pain control.  Do not to use additional NSAIDs while taking meloxicam.  May use Tylenol [phone] mg 2 to 3 times a day for breakthrough pain. Low back HEP  4 week follow up

## 2022-05-31 ENCOUNTER — Encounter (INDEPENDENT_AMBULATORY_CARE_PROVIDER_SITE_OTHER): Payer: Medicare Other | Admitting: Ophthalmology

## 2022-05-31 DIAGNOSIS — H35033 Hypertensive retinopathy, bilateral: Secondary | ICD-10-CM

## 2022-05-31 DIAGNOSIS — I10 Essential (primary) hypertension: Secondary | ICD-10-CM

## 2022-05-31 DIAGNOSIS — H43813 Vitreous degeneration, bilateral: Secondary | ICD-10-CM | POA: Diagnosis not present

## 2022-05-31 DIAGNOSIS — H353122 Nonexudative age-related macular degeneration, left eye, intermediate dry stage: Secondary | ICD-10-CM

## 2022-05-31 DIAGNOSIS — H353211 Exudative age-related macular degeneration, right eye, with active choroidal neovascularization: Secondary | ICD-10-CM

## 2022-06-01 DIAGNOSIS — H25012 Cortical age-related cataract, left eye: Secondary | ICD-10-CM | POA: Diagnosis not present

## 2022-06-01 DIAGNOSIS — H25812 Combined forms of age-related cataract, left eye: Secondary | ICD-10-CM | POA: Diagnosis not present

## 2022-06-01 DIAGNOSIS — H2512 Age-related nuclear cataract, left eye: Secondary | ICD-10-CM | POA: Diagnosis not present

## 2022-06-02 LAB — HM DIABETES EYE EXAM

## 2022-06-04 ENCOUNTER — Encounter: Payer: Self-pay | Admitting: Internal Medicine

## 2022-06-21 DIAGNOSIS — M47816 Spondylosis without myelopathy or radiculopathy, lumbar region: Secondary | ICD-10-CM | POA: Diagnosis not present

## 2022-06-21 DIAGNOSIS — M9905 Segmental and somatic dysfunction of pelvic region: Secondary | ICD-10-CM | POA: Diagnosis not present

## 2022-06-21 DIAGNOSIS — M5137 Other intervertebral disc degeneration, lumbosacral region: Secondary | ICD-10-CM | POA: Diagnosis not present

## 2022-06-21 DIAGNOSIS — M9902 Segmental and somatic dysfunction of thoracic region: Secondary | ICD-10-CM | POA: Diagnosis not present

## 2022-06-21 DIAGNOSIS — M9903 Segmental and somatic dysfunction of lumbar region: Secondary | ICD-10-CM | POA: Diagnosis not present

## 2022-06-21 DIAGNOSIS — S29012A Strain of muscle and tendon of back wall of thorax, initial encounter: Secondary | ICD-10-CM | POA: Diagnosis not present

## 2022-06-23 ENCOUNTER — Ambulatory Visit: Payer: Medicare Other | Admitting: Sports Medicine

## 2022-06-24 DIAGNOSIS — M9902 Segmental and somatic dysfunction of thoracic region: Secondary | ICD-10-CM | POA: Diagnosis not present

## 2022-06-24 DIAGNOSIS — M5137 Other intervertebral disc degeneration, lumbosacral region: Secondary | ICD-10-CM | POA: Diagnosis not present

## 2022-06-24 DIAGNOSIS — M47816 Spondylosis without myelopathy or radiculopathy, lumbar region: Secondary | ICD-10-CM | POA: Diagnosis not present

## 2022-06-24 DIAGNOSIS — S29012A Strain of muscle and tendon of back wall of thorax, initial encounter: Secondary | ICD-10-CM | POA: Diagnosis not present

## 2022-06-24 DIAGNOSIS — M9905 Segmental and somatic dysfunction of pelvic region: Secondary | ICD-10-CM | POA: Diagnosis not present

## 2022-06-24 DIAGNOSIS — M9903 Segmental and somatic dysfunction of lumbar region: Secondary | ICD-10-CM | POA: Diagnosis not present

## 2022-06-28 DIAGNOSIS — M5137 Other intervertebral disc degeneration, lumbosacral region: Secondary | ICD-10-CM | POA: Diagnosis not present

## 2022-06-28 DIAGNOSIS — M47816 Spondylosis without myelopathy or radiculopathy, lumbar region: Secondary | ICD-10-CM | POA: Diagnosis not present

## 2022-06-28 DIAGNOSIS — M9903 Segmental and somatic dysfunction of lumbar region: Secondary | ICD-10-CM | POA: Diagnosis not present

## 2022-06-28 DIAGNOSIS — M9902 Segmental and somatic dysfunction of thoracic region: Secondary | ICD-10-CM | POA: Diagnosis not present

## 2022-06-28 DIAGNOSIS — M9905 Segmental and somatic dysfunction of pelvic region: Secondary | ICD-10-CM | POA: Diagnosis not present

## 2022-06-28 DIAGNOSIS — S29012A Strain of muscle and tendon of back wall of thorax, initial encounter: Secondary | ICD-10-CM | POA: Diagnosis not present

## 2022-06-30 DIAGNOSIS — S29012A Strain of muscle and tendon of back wall of thorax, initial encounter: Secondary | ICD-10-CM | POA: Diagnosis not present

## 2022-06-30 DIAGNOSIS — M9905 Segmental and somatic dysfunction of pelvic region: Secondary | ICD-10-CM | POA: Diagnosis not present

## 2022-06-30 DIAGNOSIS — M9903 Segmental and somatic dysfunction of lumbar region: Secondary | ICD-10-CM | POA: Diagnosis not present

## 2022-06-30 DIAGNOSIS — M5137 Other intervertebral disc degeneration, lumbosacral region: Secondary | ICD-10-CM | POA: Diagnosis not present

## 2022-06-30 DIAGNOSIS — M9902 Segmental and somatic dysfunction of thoracic region: Secondary | ICD-10-CM | POA: Diagnosis not present

## 2022-06-30 DIAGNOSIS — M47816 Spondylosis without myelopathy or radiculopathy, lumbar region: Secondary | ICD-10-CM | POA: Diagnosis not present

## 2022-07-05 ENCOUNTER — Encounter (INDEPENDENT_AMBULATORY_CARE_PROVIDER_SITE_OTHER): Payer: Medicare Other | Admitting: Ophthalmology

## 2022-07-05 DIAGNOSIS — M9902 Segmental and somatic dysfunction of thoracic region: Secondary | ICD-10-CM | POA: Diagnosis not present

## 2022-07-05 DIAGNOSIS — M9903 Segmental and somatic dysfunction of lumbar region: Secondary | ICD-10-CM | POA: Diagnosis not present

## 2022-07-05 DIAGNOSIS — H43813 Vitreous degeneration, bilateral: Secondary | ICD-10-CM

## 2022-07-05 DIAGNOSIS — H353122 Nonexudative age-related macular degeneration, left eye, intermediate dry stage: Secondary | ICD-10-CM | POA: Diagnosis not present

## 2022-07-05 DIAGNOSIS — M9905 Segmental and somatic dysfunction of pelvic region: Secondary | ICD-10-CM | POA: Diagnosis not present

## 2022-07-05 DIAGNOSIS — H35033 Hypertensive retinopathy, bilateral: Secondary | ICD-10-CM | POA: Diagnosis not present

## 2022-07-05 DIAGNOSIS — M47816 Spondylosis without myelopathy or radiculopathy, lumbar region: Secondary | ICD-10-CM | POA: Diagnosis not present

## 2022-07-05 DIAGNOSIS — S29012A Strain of muscle and tendon of back wall of thorax, initial encounter: Secondary | ICD-10-CM | POA: Diagnosis not present

## 2022-07-05 DIAGNOSIS — I10 Essential (primary) hypertension: Secondary | ICD-10-CM

## 2022-07-05 DIAGNOSIS — M5137 Other intervertebral disc degeneration, lumbosacral region: Secondary | ICD-10-CM | POA: Diagnosis not present

## 2022-07-05 DIAGNOSIS — H353211 Exudative age-related macular degeneration, right eye, with active choroidal neovascularization: Secondary | ICD-10-CM

## 2022-07-08 DIAGNOSIS — M5137 Other intervertebral disc degeneration, lumbosacral region: Secondary | ICD-10-CM | POA: Diagnosis not present

## 2022-07-08 DIAGNOSIS — M9903 Segmental and somatic dysfunction of lumbar region: Secondary | ICD-10-CM | POA: Diagnosis not present

## 2022-07-08 DIAGNOSIS — M9902 Segmental and somatic dysfunction of thoracic region: Secondary | ICD-10-CM | POA: Diagnosis not present

## 2022-07-08 DIAGNOSIS — M47816 Spondylosis without myelopathy or radiculopathy, lumbar region: Secondary | ICD-10-CM | POA: Diagnosis not present

## 2022-07-08 DIAGNOSIS — S29012A Strain of muscle and tendon of back wall of thorax, initial encounter: Secondary | ICD-10-CM | POA: Diagnosis not present

## 2022-07-08 DIAGNOSIS — M9905 Segmental and somatic dysfunction of pelvic region: Secondary | ICD-10-CM | POA: Diagnosis not present

## 2022-07-13 DIAGNOSIS — M5137 Other intervertebral disc degeneration, lumbosacral region: Secondary | ICD-10-CM | POA: Diagnosis not present

## 2022-07-13 DIAGNOSIS — S29012A Strain of muscle and tendon of back wall of thorax, initial encounter: Secondary | ICD-10-CM | POA: Diagnosis not present

## 2022-07-13 DIAGNOSIS — M9903 Segmental and somatic dysfunction of lumbar region: Secondary | ICD-10-CM | POA: Diagnosis not present

## 2022-07-13 DIAGNOSIS — M9902 Segmental and somatic dysfunction of thoracic region: Secondary | ICD-10-CM | POA: Diagnosis not present

## 2022-07-13 DIAGNOSIS — M47816 Spondylosis without myelopathy or radiculopathy, lumbar region: Secondary | ICD-10-CM | POA: Diagnosis not present

## 2022-07-13 DIAGNOSIS — M9905 Segmental and somatic dysfunction of pelvic region: Secondary | ICD-10-CM | POA: Diagnosis not present

## 2022-07-15 DIAGNOSIS — M9902 Segmental and somatic dysfunction of thoracic region: Secondary | ICD-10-CM | POA: Diagnosis not present

## 2022-07-15 DIAGNOSIS — M5137 Other intervertebral disc degeneration, lumbosacral region: Secondary | ICD-10-CM | POA: Diagnosis not present

## 2022-07-15 DIAGNOSIS — M47816 Spondylosis without myelopathy or radiculopathy, lumbar region: Secondary | ICD-10-CM | POA: Diagnosis not present

## 2022-07-15 DIAGNOSIS — S29012A Strain of muscle and tendon of back wall of thorax, initial encounter: Secondary | ICD-10-CM | POA: Diagnosis not present

## 2022-07-15 DIAGNOSIS — M9903 Segmental and somatic dysfunction of lumbar region: Secondary | ICD-10-CM | POA: Diagnosis not present

## 2022-07-15 DIAGNOSIS — M9905 Segmental and somatic dysfunction of pelvic region: Secondary | ICD-10-CM | POA: Diagnosis not present

## 2022-07-19 DIAGNOSIS — M5137 Other intervertebral disc degeneration, lumbosacral region: Secondary | ICD-10-CM | POA: Diagnosis not present

## 2022-07-19 DIAGNOSIS — S29012A Strain of muscle and tendon of back wall of thorax, initial encounter: Secondary | ICD-10-CM | POA: Diagnosis not present

## 2022-07-19 DIAGNOSIS — M9903 Segmental and somatic dysfunction of lumbar region: Secondary | ICD-10-CM | POA: Diagnosis not present

## 2022-07-19 DIAGNOSIS — M47816 Spondylosis without myelopathy or radiculopathy, lumbar region: Secondary | ICD-10-CM | POA: Diagnosis not present

## 2022-07-19 DIAGNOSIS — M9902 Segmental and somatic dysfunction of thoracic region: Secondary | ICD-10-CM | POA: Diagnosis not present

## 2022-07-19 DIAGNOSIS — M9905 Segmental and somatic dysfunction of pelvic region: Secondary | ICD-10-CM | POA: Diagnosis not present

## 2022-07-22 ENCOUNTER — Telehealth: Payer: Self-pay | Admitting: Internal Medicine

## 2022-07-22 DIAGNOSIS — M47816 Spondylosis without myelopathy or radiculopathy, lumbar region: Secondary | ICD-10-CM | POA: Diagnosis not present

## 2022-07-22 DIAGNOSIS — M9903 Segmental and somatic dysfunction of lumbar region: Secondary | ICD-10-CM | POA: Diagnosis not present

## 2022-07-22 DIAGNOSIS — M9902 Segmental and somatic dysfunction of thoracic region: Secondary | ICD-10-CM | POA: Diagnosis not present

## 2022-07-22 DIAGNOSIS — M9905 Segmental and somatic dysfunction of pelvic region: Secondary | ICD-10-CM | POA: Diagnosis not present

## 2022-07-22 DIAGNOSIS — M5137 Other intervertebral disc degeneration, lumbosacral region: Secondary | ICD-10-CM | POA: Diagnosis not present

## 2022-07-22 DIAGNOSIS — S29012A Strain of muscle and tendon of back wall of thorax, initial encounter: Secondary | ICD-10-CM | POA: Diagnosis not present

## 2022-07-22 NOTE — Telephone Encounter (Signed)
Patient wants to know if we received a form from her chiropractor requesting a vascular ultrasound - Please call patient and let her know.  Phone:  [phone]

## 2022-07-23 NOTE — Telephone Encounter (Signed)
Needs visit

## 2022-07-26 DIAGNOSIS — M47816 Spondylosis without myelopathy or radiculopathy, lumbar region: Secondary | ICD-10-CM | POA: Diagnosis not present

## 2022-07-26 DIAGNOSIS — M9902 Segmental and somatic dysfunction of thoracic region: Secondary | ICD-10-CM | POA: Diagnosis not present

## 2022-07-26 DIAGNOSIS — S29012A Strain of muscle and tendon of back wall of thorax, initial encounter: Secondary | ICD-10-CM | POA: Diagnosis not present

## 2022-07-26 DIAGNOSIS — M9903 Segmental and somatic dysfunction of lumbar region: Secondary | ICD-10-CM | POA: Diagnosis not present

## 2022-07-26 DIAGNOSIS — M9905 Segmental and somatic dysfunction of pelvic region: Secondary | ICD-10-CM | POA: Diagnosis not present

## 2022-07-26 DIAGNOSIS — M5137 Other intervertebral disc degeneration, lumbosacral region: Secondary | ICD-10-CM | POA: Diagnosis not present

## 2022-07-30 DIAGNOSIS — M9905 Segmental and somatic dysfunction of pelvic region: Secondary | ICD-10-CM | POA: Diagnosis not present

## 2022-07-30 DIAGNOSIS — M47816 Spondylosis without myelopathy or radiculopathy, lumbar region: Secondary | ICD-10-CM | POA: Diagnosis not present

## 2022-07-30 DIAGNOSIS — S29012A Strain of muscle and tendon of back wall of thorax, initial encounter: Secondary | ICD-10-CM | POA: Diagnosis not present

## 2022-07-30 DIAGNOSIS — M9903 Segmental and somatic dysfunction of lumbar region: Secondary | ICD-10-CM | POA: Diagnosis not present

## 2022-07-30 DIAGNOSIS — M5137 Other intervertebral disc degeneration, lumbosacral region: Secondary | ICD-10-CM | POA: Diagnosis not present

## 2022-07-30 DIAGNOSIS — M9902 Segmental and somatic dysfunction of thoracic region: Secondary | ICD-10-CM | POA: Diagnosis not present

## 2022-08-02 ENCOUNTER — Ambulatory Visit (INDEPENDENT_AMBULATORY_CARE_PROVIDER_SITE_OTHER): Payer: Medicare Other | Admitting: Internal Medicine

## 2022-08-02 ENCOUNTER — Encounter: Payer: Self-pay | Admitting: Internal Medicine

## 2022-08-02 VITALS — BP 150/82 | HR 83 | Temp 98.5°F | Ht 59.0 in | Wt 159.0 lb

## 2022-08-02 DIAGNOSIS — M9903 Segmental and somatic dysfunction of lumbar region: Secondary | ICD-10-CM | POA: Diagnosis not present

## 2022-08-02 DIAGNOSIS — M5416 Radiculopathy, lumbar region: Secondary | ICD-10-CM

## 2022-08-02 DIAGNOSIS — M79604 Pain in right leg: Secondary | ICD-10-CM

## 2022-08-02 DIAGNOSIS — M9902 Segmental and somatic dysfunction of thoracic region: Secondary | ICD-10-CM | POA: Diagnosis not present

## 2022-08-02 DIAGNOSIS — M79605 Pain in left leg: Secondary | ICD-10-CM | POA: Diagnosis not present

## 2022-08-02 DIAGNOSIS — F4323 Adjustment disorder with mixed anxiety and depressed mood: Secondary | ICD-10-CM | POA: Diagnosis not present

## 2022-08-02 DIAGNOSIS — M5137 Other intervertebral disc degeneration, lumbosacral region: Secondary | ICD-10-CM | POA: Diagnosis not present

## 2022-08-02 DIAGNOSIS — M47816 Spondylosis without myelopathy or radiculopathy, lumbar region: Secondary | ICD-10-CM | POA: Diagnosis not present

## 2022-08-02 DIAGNOSIS — S29012A Strain of muscle and tendon of back wall of thorax, initial encounter: Secondary | ICD-10-CM | POA: Diagnosis not present

## 2022-08-02 DIAGNOSIS — M9905 Segmental and somatic dysfunction of pelvic region: Secondary | ICD-10-CM | POA: Diagnosis not present

## 2022-08-02 MED ORDER — DULOXETINE HCL 30 MG PO CPEP: 30.0000 mg | ORAL_CAPSULE | Freq: Every day | ORAL | 3 refills | Status: DC

## 2022-08-02 NOTE — Progress Notes (Unsigned)
   Subjective:   Patient ID: Erica [name], female    DOB: [birth year], 72 y.o.   MRN: [id number]  HPI The patient is a 72 YO female coming in for multiple concerns including leg swelling seen multiple times by chiropractor and they felt she needed assessment and possibly ultrasound. Saw sports medicine and was not overly satisfied with evaluation. Prior epidural injections did not provide relief for more than a few weeks does not want to pursue further.   Review of Systems  Constitutional:  Positive for activity change. Negative for appetite change, chills, fatigue, fever and unexpected weight change.  Respiratory: Negative.    Cardiovascular: Negative.   Gastrointestinal: Negative.   Musculoskeletal:  Positive for arthralgias, back pain and myalgias. Negative for gait problem and joint swelling.  Skin: Negative.   Neurological: Negative.     Objective:  Physical Exam Constitutional:      Appearance: She is well-developed.  HENT:     Head: Normocephalic and atraumatic.  Cardiovascular:     Rate and Rhythm: Normal rate and regular rhythm.  Pulmonary:     Effort: Pulmonary effort is normal. No respiratory distress.     Breath sounds: Normal breath sounds. No wheezing or rales.  Abdominal:     General: Bowel sounds are normal. There is no distension.     Palpations: Abdomen is soft.     Tenderness: There is no abdominal tenderness. There is no rebound.  Musculoskeletal:        General: Tenderness present.     Cervical back: Normal range of motion.     Comments: Pain in back and down right leg more than left  Skin:    General: Skin is warm and dry.  Neurological:     Mental Status: She is alert and oriented to person, place, and time.     Coordination: Coordination normal.     Vitals:   08/02/22 1519 08/02/22 1522  BP: (!) 150/82 (!) 150/82  Pulse: 83   Temp: 98.5 F (36.9 C)   TempSrc: Oral   SpO2: 96%   Weight: 159 lb (72.1 kg)   Height: 4\' 11"  (1.499 m)      Assessment & Plan:  Visit time 25 minutes in face to face communication with patient and coordination of care, additional 10 minutes spent in record review, coordination or care, ordering tests, communicating/referring to other healthcare professionals, documenting in medical records all on the same day of the visit for total time 35 minutes spent on the visit.

## 2022-08-02 NOTE — Patient Instructions (Addendum)
We will try the lunesta 2 pills or 3 pills at night time for sleep and if effective use 2-3 times a week for 2- 3 weeks to restore the sleep.  We have sent in cymbalta to take 1 pill daily to see if this helps the mood and the pain.  We will check the ultrasound.

## 2022-08-03 ENCOUNTER — Ambulatory Visit: Payer: Medicare Other | Admitting: Internal Medicine

## 2022-08-03 DIAGNOSIS — M79605 Pain in left leg: Secondary | ICD-10-CM | POA: Insufficient documentation

## 2022-08-03 NOTE — Assessment & Plan Note (Signed)
Has had MRI lumbar with some changes. No relief from prior epidurals and no significant response with chiropractor. She does have some swelling both legs and no prior assessment of the veins. Checking [place] to rule out DVT. She is in worse pain last few months.

## 2022-08-03 NOTE — Assessment & Plan Note (Signed)
She has had MRI done in last 3-6 months which was stable with some significant disease. Referral to sports medicine she did not feel was helpful. She is seeing chiropractor and they felt she needed assessment for thrombosis. She does not have new swelling on exam. Pain is present for months. We will order [place] to assess for DVT although this seems less likely. She is not having much improvement with treatments.

## 2022-08-03 NOTE — Assessment & Plan Note (Signed)
Significant symptoms and is on wellbutrin 300 mg daily. We decided to add cymbalta 30 mg daily for added benefit of possible pain reduction. Give this 3-4 weeks and can reassess need for change or dose adjustment.

## 2022-08-05 DIAGNOSIS — M5137 Other intervertebral disc degeneration, lumbosacral region: Secondary | ICD-10-CM | POA: Diagnosis not present

## 2022-08-05 DIAGNOSIS — M9902 Segmental and somatic dysfunction of thoracic region: Secondary | ICD-10-CM | POA: Diagnosis not present

## 2022-08-05 DIAGNOSIS — M9903 Segmental and somatic dysfunction of lumbar region: Secondary | ICD-10-CM | POA: Diagnosis not present

## 2022-08-05 DIAGNOSIS — S29012A Strain of muscle and tendon of back wall of thorax, initial encounter: Secondary | ICD-10-CM | POA: Diagnosis not present

## 2022-08-05 DIAGNOSIS — M9905 Segmental and somatic dysfunction of pelvic region: Secondary | ICD-10-CM | POA: Diagnosis not present

## 2022-08-05 DIAGNOSIS — M47816 Spondylosis without myelopathy or radiculopathy, lumbar region: Secondary | ICD-10-CM | POA: Diagnosis not present

## 2022-08-09 ENCOUNTER — Encounter (INDEPENDENT_AMBULATORY_CARE_PROVIDER_SITE_OTHER): Payer: Medicare Other | Admitting: Ophthalmology

## 2022-08-09 DIAGNOSIS — H353211 Exudative age-related macular degeneration, right eye, with active choroidal neovascularization: Secondary | ICD-10-CM

## 2022-08-09 DIAGNOSIS — I10 Essential (primary) hypertension: Secondary | ICD-10-CM | POA: Diagnosis not present

## 2022-08-09 DIAGNOSIS — H35033 Hypertensive retinopathy, bilateral: Secondary | ICD-10-CM

## 2022-08-09 DIAGNOSIS — M9902 Segmental and somatic dysfunction of thoracic region: Secondary | ICD-10-CM | POA: Diagnosis not present

## 2022-08-09 DIAGNOSIS — M9903 Segmental and somatic dysfunction of lumbar region: Secondary | ICD-10-CM | POA: Diagnosis not present

## 2022-08-09 DIAGNOSIS — H353122 Nonexudative age-related macular degeneration, left eye, intermediate dry stage: Secondary | ICD-10-CM

## 2022-08-09 DIAGNOSIS — S29012A Strain of muscle and tendon of back wall of thorax, initial encounter: Secondary | ICD-10-CM | POA: Diagnosis not present

## 2022-08-09 DIAGNOSIS — H43813 Vitreous degeneration, bilateral: Secondary | ICD-10-CM | POA: Diagnosis not present

## 2022-08-09 DIAGNOSIS — M5137 Other intervertebral disc degeneration, lumbosacral region: Secondary | ICD-10-CM | POA: Diagnosis not present

## 2022-08-09 DIAGNOSIS — M9905 Segmental and somatic dysfunction of pelvic region: Secondary | ICD-10-CM | POA: Diagnosis not present

## 2022-08-09 DIAGNOSIS — M47816 Spondylosis without myelopathy or radiculopathy, lumbar region: Secondary | ICD-10-CM | POA: Diagnosis not present

## 2022-08-12 ENCOUNTER — Ambulatory Visit (HOSPITAL_COMMUNITY)
Admission: RE | Admit: 2022-08-12 | Discharge: 2022-08-12 | Disposition: A | Discharge status: Home / Self Care | Payer: Medicare Other | Source: Ambulatory Visit | Attending: Cardiology | Admitting: Cardiology

## 2022-08-12 DIAGNOSIS — M9903 Segmental and somatic dysfunction of lumbar region: Secondary | ICD-10-CM | POA: Diagnosis not present

## 2022-08-12 DIAGNOSIS — M9905 Segmental and somatic dysfunction of pelvic region: Secondary | ICD-10-CM | POA: Diagnosis not present

## 2022-08-12 DIAGNOSIS — M79605 Pain in left leg: Secondary | ICD-10-CM

## 2022-08-12 DIAGNOSIS — M79604 Pain in right leg: Secondary | ICD-10-CM | POA: Insufficient documentation

## 2022-08-12 DIAGNOSIS — S29012A Strain of muscle and tendon of back wall of thorax, initial encounter: Secondary | ICD-10-CM | POA: Diagnosis not present

## 2022-08-12 DIAGNOSIS — M5137 Other intervertebral disc degeneration, lumbosacral region: Secondary | ICD-10-CM | POA: Diagnosis not present

## 2022-08-12 DIAGNOSIS — M47816 Spondylosis without myelopathy or radiculopathy, lumbar region: Secondary | ICD-10-CM | POA: Diagnosis not present

## 2022-08-12 DIAGNOSIS — M9902 Segmental and somatic dysfunction of thoracic region: Secondary | ICD-10-CM | POA: Diagnosis not present

## 2022-08-16 DIAGNOSIS — M9902 Segmental and somatic dysfunction of thoracic region: Secondary | ICD-10-CM | POA: Diagnosis not present

## 2022-08-16 DIAGNOSIS — M5137 Other intervertebral disc degeneration, lumbosacral region: Secondary | ICD-10-CM | POA: Diagnosis not present

## 2022-08-16 DIAGNOSIS — S29012A Strain of muscle and tendon of back wall of thorax, initial encounter: Secondary | ICD-10-CM | POA: Diagnosis not present

## 2022-08-16 DIAGNOSIS — M9905 Segmental and somatic dysfunction of pelvic region: Secondary | ICD-10-CM | POA: Diagnosis not present

## 2022-08-16 DIAGNOSIS — M47816 Spondylosis without myelopathy or radiculopathy, lumbar region: Secondary | ICD-10-CM | POA: Diagnosis not present

## 2022-08-16 DIAGNOSIS — M9903 Segmental and somatic dysfunction of lumbar region: Secondary | ICD-10-CM | POA: Diagnosis not present

## 2022-08-19 DIAGNOSIS — M5137 Other intervertebral disc degeneration, lumbosacral region: Secondary | ICD-10-CM | POA: Diagnosis not present

## 2022-08-19 DIAGNOSIS — M47816 Spondylosis without myelopathy or radiculopathy, lumbar region: Secondary | ICD-10-CM | POA: Diagnosis not present

## 2022-08-19 DIAGNOSIS — M9905 Segmental and somatic dysfunction of pelvic region: Secondary | ICD-10-CM | POA: Diagnosis not present

## 2022-08-19 DIAGNOSIS — S29012A Strain of muscle and tendon of back wall of thorax, initial encounter: Secondary | ICD-10-CM | POA: Diagnosis not present

## 2022-08-19 DIAGNOSIS — M9903 Segmental and somatic dysfunction of lumbar region: Secondary | ICD-10-CM | POA: Diagnosis not present

## 2022-08-19 DIAGNOSIS — M9902 Segmental and somatic dysfunction of thoracic region: Secondary | ICD-10-CM | POA: Diagnosis not present

## 2022-08-23 DIAGNOSIS — S29012A Strain of muscle and tendon of back wall of thorax, initial encounter: Secondary | ICD-10-CM | POA: Diagnosis not present

## 2022-08-23 DIAGNOSIS — M5137 Other intervertebral disc degeneration, lumbosacral region: Secondary | ICD-10-CM | POA: Diagnosis not present

## 2022-08-23 DIAGNOSIS — M47816 Spondylosis without myelopathy or radiculopathy, lumbar region: Secondary | ICD-10-CM | POA: Diagnosis not present

## 2022-08-23 DIAGNOSIS — M9902 Segmental and somatic dysfunction of thoracic region: Secondary | ICD-10-CM | POA: Diagnosis not present

## 2022-08-23 DIAGNOSIS — M9903 Segmental and somatic dysfunction of lumbar region: Secondary | ICD-10-CM | POA: Diagnosis not present

## 2022-08-23 DIAGNOSIS — M9905 Segmental and somatic dysfunction of pelvic region: Secondary | ICD-10-CM | POA: Diagnosis not present

## 2022-08-26 ENCOUNTER — Other Ambulatory Visit: Payer: Self-pay | Admitting: Internal Medicine

## 2022-08-26 DIAGNOSIS — M9903 Segmental and somatic dysfunction of lumbar region: Secondary | ICD-10-CM | POA: Diagnosis not present

## 2022-08-26 DIAGNOSIS — M9905 Segmental and somatic dysfunction of pelvic region: Secondary | ICD-10-CM | POA: Diagnosis not present

## 2022-08-26 DIAGNOSIS — M47816 Spondylosis without myelopathy or radiculopathy, lumbar region: Secondary | ICD-10-CM | POA: Diagnosis not present

## 2022-08-26 DIAGNOSIS — S29012A Strain of muscle and tendon of back wall of thorax, initial encounter: Secondary | ICD-10-CM | POA: Diagnosis not present

## 2022-08-26 DIAGNOSIS — M5137 Other intervertebral disc degeneration, lumbosacral region: Secondary | ICD-10-CM | POA: Diagnosis not present

## 2022-08-26 DIAGNOSIS — M9902 Segmental and somatic dysfunction of thoracic region: Secondary | ICD-10-CM | POA: Diagnosis not present

## 2022-08-30 DIAGNOSIS — S29012A Strain of muscle and tendon of back wall of thorax, initial encounter: Secondary | ICD-10-CM | POA: Diagnosis not present

## 2022-08-30 DIAGNOSIS — M9905 Segmental and somatic dysfunction of pelvic region: Secondary | ICD-10-CM | POA: Diagnosis not present

## 2022-08-30 DIAGNOSIS — M9902 Segmental and somatic dysfunction of thoracic region: Secondary | ICD-10-CM | POA: Diagnosis not present

## 2022-08-30 DIAGNOSIS — M47816 Spondylosis without myelopathy or radiculopathy, lumbar region: Secondary | ICD-10-CM | POA: Diagnosis not present

## 2022-08-30 DIAGNOSIS — M9903 Segmental and somatic dysfunction of lumbar region: Secondary | ICD-10-CM | POA: Diagnosis not present

## 2022-08-30 DIAGNOSIS — M5137 Other intervertebral disc degeneration, lumbosacral region: Secondary | ICD-10-CM | POA: Diagnosis not present

## 2022-08-31 ENCOUNTER — Telehealth: Payer: Self-pay | Admitting: Internal Medicine

## 2022-08-31 NOTE — Telephone Encounter (Signed)
Patient called and asked if the dosage of her DULoxetine (CYMBALTA) 30 MG capsule can be increased to 40 mg. She would like for it to be sent to [id number] - [place], [place] - [address] AT [address] OF [facility] [address]  Patient said she has 3 days worth left. Best callback is [phone].

## 2022-09-01 NOTE — Telephone Encounter (Signed)
The next dose up is 60 mg would she like to increase to this?

## 2022-09-01 NOTE — Telephone Encounter (Signed)
Pt stated yes to the increase in dosage

## 2022-09-03 MED ORDER — DULOXETINE HCL 60 MG PO CPEP: 60.0000 mg | ORAL_CAPSULE | Freq: Every day | ORAL | 0 refills | Status: DC

## 2022-09-03 NOTE — Telephone Encounter (Signed)
Sent in

## 2022-09-03 NOTE — Addendum Note (Signed)
Addended by: Hillard Danker A on: 09/03/2022 08:01 AM   Modules accepted: Orders

## 2022-09-06 DIAGNOSIS — M9902 Segmental and somatic dysfunction of thoracic region: Secondary | ICD-10-CM | POA: Diagnosis not present

## 2022-09-06 DIAGNOSIS — M5137 Other intervertebral disc degeneration, lumbosacral region: Secondary | ICD-10-CM | POA: Diagnosis not present

## 2022-09-06 DIAGNOSIS — M9905 Segmental and somatic dysfunction of pelvic region: Secondary | ICD-10-CM | POA: Diagnosis not present

## 2022-09-06 DIAGNOSIS — S29012A Strain of muscle and tendon of back wall of thorax, initial encounter: Secondary | ICD-10-CM | POA: Diagnosis not present

## 2022-09-06 DIAGNOSIS — M9903 Segmental and somatic dysfunction of lumbar region: Secondary | ICD-10-CM | POA: Diagnosis not present

## 2022-09-06 DIAGNOSIS — M47816 Spondylosis without myelopathy or radiculopathy, lumbar region: Secondary | ICD-10-CM | POA: Diagnosis not present

## 2022-09-13 ENCOUNTER — Encounter (INDEPENDENT_AMBULATORY_CARE_PROVIDER_SITE_OTHER): Payer: Medicare Other | Admitting: Ophthalmology

## 2022-09-13 DIAGNOSIS — M47816 Spondylosis without myelopathy or radiculopathy, lumbar region: Secondary | ICD-10-CM | POA: Diagnosis not present

## 2022-09-13 DIAGNOSIS — H35033 Hypertensive retinopathy, bilateral: Secondary | ICD-10-CM

## 2022-09-13 DIAGNOSIS — M9902 Segmental and somatic dysfunction of thoracic region: Secondary | ICD-10-CM | POA: Diagnosis not present

## 2022-09-13 DIAGNOSIS — S29012A Strain of muscle and tendon of back wall of thorax, initial encounter: Secondary | ICD-10-CM | POA: Diagnosis not present

## 2022-09-13 DIAGNOSIS — H43813 Vitreous degeneration, bilateral: Secondary | ICD-10-CM | POA: Diagnosis not present

## 2022-09-13 DIAGNOSIS — I10 Essential (primary) hypertension: Secondary | ICD-10-CM | POA: Diagnosis not present

## 2022-09-13 DIAGNOSIS — M9903 Segmental and somatic dysfunction of lumbar region: Secondary | ICD-10-CM | POA: Diagnosis not present

## 2022-09-13 DIAGNOSIS — H353211 Exudative age-related macular degeneration, right eye, with active choroidal neovascularization: Secondary | ICD-10-CM

## 2022-09-13 DIAGNOSIS — H353122 Nonexudative age-related macular degeneration, left eye, intermediate dry stage: Secondary | ICD-10-CM

## 2022-09-13 DIAGNOSIS — M5137 Other intervertebral disc degeneration, lumbosacral region: Secondary | ICD-10-CM | POA: Diagnosis not present

## 2022-09-13 DIAGNOSIS — M9905 Segmental and somatic dysfunction of pelvic region: Secondary | ICD-10-CM | POA: Diagnosis not present

## 2022-09-20 DIAGNOSIS — H40013 Open angle with borderline findings, low risk, bilateral: Secondary | ICD-10-CM | POA: Diagnosis not present

## 2022-09-20 DIAGNOSIS — M9903 Segmental and somatic dysfunction of lumbar region: Secondary | ICD-10-CM | POA: Diagnosis not present

## 2022-09-20 DIAGNOSIS — H353211 Exudative age-related macular degeneration, right eye, with active choroidal neovascularization: Secondary | ICD-10-CM | POA: Diagnosis not present

## 2022-09-20 DIAGNOSIS — M9902 Segmental and somatic dysfunction of thoracic region: Secondary | ICD-10-CM | POA: Diagnosis not present

## 2022-09-20 DIAGNOSIS — M9905 Segmental and somatic dysfunction of pelvic region: Secondary | ICD-10-CM | POA: Diagnosis not present

## 2022-09-20 DIAGNOSIS — H353122 Nonexudative age-related macular degeneration, left eye, intermediate dry stage: Secondary | ICD-10-CM | POA: Diagnosis not present

## 2022-09-20 DIAGNOSIS — M47816 Spondylosis without myelopathy or radiculopathy, lumbar region: Secondary | ICD-10-CM | POA: Diagnosis not present

## 2022-09-20 DIAGNOSIS — S29012A Strain of muscle and tendon of back wall of thorax, initial encounter: Secondary | ICD-10-CM | POA: Diagnosis not present

## 2022-09-20 DIAGNOSIS — M5137 Other intervertebral disc degeneration, lumbosacral region: Secondary | ICD-10-CM | POA: Diagnosis not present

## 2022-09-20 DIAGNOSIS — H35033 Hypertensive retinopathy, bilateral: Secondary | ICD-10-CM | POA: Diagnosis not present

## 2022-09-27 DIAGNOSIS — S29012A Strain of muscle and tendon of back wall of thorax, initial encounter: Secondary | ICD-10-CM | POA: Diagnosis not present

## 2022-09-27 DIAGNOSIS — M47816 Spondylosis without myelopathy or radiculopathy, lumbar region: Secondary | ICD-10-CM | POA: Diagnosis not present

## 2022-09-27 DIAGNOSIS — M5137 Other intervertebral disc degeneration, lumbosacral region: Secondary | ICD-10-CM | POA: Diagnosis not present

## 2022-09-27 DIAGNOSIS — M9903 Segmental and somatic dysfunction of lumbar region: Secondary | ICD-10-CM | POA: Diagnosis not present

## 2022-09-27 DIAGNOSIS — M9905 Segmental and somatic dysfunction of pelvic region: Secondary | ICD-10-CM | POA: Diagnosis not present

## 2022-09-27 DIAGNOSIS — M9902 Segmental and somatic dysfunction of thoracic region: Secondary | ICD-10-CM | POA: Diagnosis not present

## 2022-10-05 DIAGNOSIS — M9902 Segmental and somatic dysfunction of thoracic region: Secondary | ICD-10-CM | POA: Diagnosis not present

## 2022-10-05 DIAGNOSIS — M47816 Spondylosis without myelopathy or radiculopathy, lumbar region: Secondary | ICD-10-CM | POA: Diagnosis not present

## 2022-10-05 DIAGNOSIS — M5137 Other intervertebral disc degeneration, lumbosacral region: Secondary | ICD-10-CM | POA: Diagnosis not present

## 2022-10-05 DIAGNOSIS — S29012A Strain of muscle and tendon of back wall of thorax, initial encounter: Secondary | ICD-10-CM | POA: Diagnosis not present

## 2022-10-05 DIAGNOSIS — M9905 Segmental and somatic dysfunction of pelvic region: Secondary | ICD-10-CM | POA: Diagnosis not present

## 2022-10-05 DIAGNOSIS — M9903 Segmental and somatic dysfunction of lumbar region: Secondary | ICD-10-CM | POA: Diagnosis not present

## 2022-10-12 DIAGNOSIS — M9905 Segmental and somatic dysfunction of pelvic region: Secondary | ICD-10-CM | POA: Diagnosis not present

## 2022-10-12 DIAGNOSIS — M9902 Segmental and somatic dysfunction of thoracic region: Secondary | ICD-10-CM | POA: Diagnosis not present

## 2022-10-12 DIAGNOSIS — M9903 Segmental and somatic dysfunction of lumbar region: Secondary | ICD-10-CM | POA: Diagnosis not present

## 2022-10-12 DIAGNOSIS — M5137 Other intervertebral disc degeneration, lumbosacral region: Secondary | ICD-10-CM | POA: Diagnosis not present

## 2022-10-12 DIAGNOSIS — S29012A Strain of muscle and tendon of back wall of thorax, initial encounter: Secondary | ICD-10-CM | POA: Diagnosis not present

## 2022-10-12 DIAGNOSIS — M47816 Spondylosis without myelopathy or radiculopathy, lumbar region: Secondary | ICD-10-CM | POA: Diagnosis not present

## 2022-10-21 DIAGNOSIS — M47816 Spondylosis without myelopathy or radiculopathy, lumbar region: Secondary | ICD-10-CM | POA: Diagnosis not present

## 2022-10-21 DIAGNOSIS — M5137 Other intervertebral disc degeneration, lumbosacral region: Secondary | ICD-10-CM | POA: Diagnosis not present

## 2022-10-21 DIAGNOSIS — M9903 Segmental and somatic dysfunction of lumbar region: Secondary | ICD-10-CM | POA: Diagnosis not present

## 2022-10-21 DIAGNOSIS — M9902 Segmental and somatic dysfunction of thoracic region: Secondary | ICD-10-CM | POA: Diagnosis not present

## 2022-10-21 DIAGNOSIS — S29012A Strain of muscle and tendon of back wall of thorax, initial encounter: Secondary | ICD-10-CM | POA: Diagnosis not present

## 2022-10-21 DIAGNOSIS — M9905 Segmental and somatic dysfunction of pelvic region: Secondary | ICD-10-CM | POA: Diagnosis not present

## 2022-10-25 ENCOUNTER — Encounter (INDEPENDENT_AMBULATORY_CARE_PROVIDER_SITE_OTHER): Payer: Medicare Other | Admitting: Ophthalmology

## 2022-10-25 DIAGNOSIS — H353122 Nonexudative age-related macular degeneration, left eye, intermediate dry stage: Secondary | ICD-10-CM

## 2022-10-25 DIAGNOSIS — H35033 Hypertensive retinopathy, bilateral: Secondary | ICD-10-CM | POA: Diagnosis not present

## 2022-10-25 DIAGNOSIS — H353211 Exudative age-related macular degeneration, right eye, with active choroidal neovascularization: Secondary | ICD-10-CM

## 2022-10-25 DIAGNOSIS — H43813 Vitreous degeneration, bilateral: Secondary | ICD-10-CM | POA: Diagnosis not present

## 2022-10-25 DIAGNOSIS — I10 Essential (primary) hypertension: Secondary | ICD-10-CM

## 2022-10-26 ENCOUNTER — Telehealth: Payer: Self-pay | Admitting: Internal Medicine

## 2022-10-26 NOTE — Telephone Encounter (Signed)
Pt called stating her and Dr. Okey Dupre was trying a new medication Eszopiclone and pt called back letting Dr. Okey Dupre know that the 3mg  work best for her and can she send it to her pharmacy.  Prescription Request  10/26/2022  LOV: 08/02/2022  What is the name of the medication or equipment? Eszopiclone  Have you contacted your pharmacy to request a refill? No   Which pharmacy would you like this sent to?  [id number] - [place], [place] - [address] [facility] [address] [place] [address] Phone: [phone] Fax: [phone]    Patient notified that their request is being sent to the clinical staff for review and that they should receive a response within 2 business days.   Please advise at Mobile [phone] (mobile)

## 2022-10-29 MED ORDER — ESZOPICLONE 3 MG PO TABS: 3.0000 mg | ORAL_TABLET | Freq: Every day | ORAL | 0 refills | Status: DC

## 2022-10-29 NOTE — Telephone Encounter (Signed)
Sent in

## 2022-10-29 NOTE — Telephone Encounter (Signed)
Called patient and informed her about prescription being sent in.

## 2022-11-03 DIAGNOSIS — S29012A Strain of muscle and tendon of back wall of thorax, initial encounter: Secondary | ICD-10-CM | POA: Diagnosis not present

## 2022-11-03 DIAGNOSIS — M9905 Segmental and somatic dysfunction of pelvic region: Secondary | ICD-10-CM | POA: Diagnosis not present

## 2022-11-03 DIAGNOSIS — M9903 Segmental and somatic dysfunction of lumbar region: Secondary | ICD-10-CM | POA: Diagnosis not present

## 2022-11-03 DIAGNOSIS — M5137 Other intervertebral disc degeneration, lumbosacral region: Secondary | ICD-10-CM | POA: Diagnosis not present

## 2022-11-03 DIAGNOSIS — M9902 Segmental and somatic dysfunction of thoracic region: Secondary | ICD-10-CM | POA: Diagnosis not present

## 2022-11-03 DIAGNOSIS — M47816 Spondylosis without myelopathy or radiculopathy, lumbar region: Secondary | ICD-10-CM | POA: Diagnosis not present

## 2022-11-10 DIAGNOSIS — M9903 Segmental and somatic dysfunction of lumbar region: Secondary | ICD-10-CM | POA: Diagnosis not present

## 2022-11-10 DIAGNOSIS — M47816 Spondylosis without myelopathy or radiculopathy, lumbar region: Secondary | ICD-10-CM | POA: Diagnosis not present

## 2022-11-10 DIAGNOSIS — M9905 Segmental and somatic dysfunction of pelvic region: Secondary | ICD-10-CM | POA: Diagnosis not present

## 2022-11-10 DIAGNOSIS — M5137 Other intervertebral disc degeneration, lumbosacral region: Secondary | ICD-10-CM | POA: Diagnosis not present

## 2022-11-10 DIAGNOSIS — M9902 Segmental and somatic dysfunction of thoracic region: Secondary | ICD-10-CM | POA: Diagnosis not present

## 2022-11-10 DIAGNOSIS — S29012A Strain of muscle and tendon of back wall of thorax, initial encounter: Secondary | ICD-10-CM | POA: Diagnosis not present

## 2022-11-22 ENCOUNTER — Ambulatory Visit (INDEPENDENT_AMBULATORY_CARE_PROVIDER_SITE_OTHER): Payer: Medicare Other | Admitting: Internal Medicine

## 2022-11-22 ENCOUNTER — Encounter: Payer: Self-pay | Admitting: Internal Medicine

## 2022-11-22 VITALS — BP 120/84 | HR 91 | Temp 98.6°F | Ht <= 58 in | Wt 153.0 lb

## 2022-11-22 DIAGNOSIS — F5104 Psychophysiologic insomnia: Secondary | ICD-10-CM | POA: Diagnosis not present

## 2022-11-22 DIAGNOSIS — F4323 Adjustment disorder with mixed anxiety and depressed mood: Secondary | ICD-10-CM | POA: Diagnosis not present

## 2022-11-22 MED ORDER — DULOXETINE HCL 60 MG PO CPEP: 60.0000 mg | ORAL_CAPSULE | Freq: Every day | ORAL | 3 refills | Status: DC

## 2022-11-22 MED ORDER — ALPRAZOLAM 0.25 MG PO TABS: 0.2500 mg | ORAL_TABLET | Freq: Two times a day (BID) | ORAL | 1 refills | Status: DC | PRN

## 2022-11-22 MED ORDER — ESZOPICLONE 3 MG PO TABS: 3.0000 mg | ORAL_TABLET | Freq: Every day | ORAL | 2 refills | Status: DC

## 2022-11-22 NOTE — Progress Notes (Signed)
   Subjective:   Patient ID: Erica [name], female    DOB: [birth year], 72 y.o.   MRN: [id number]  HPI The patient is a 72 YO female coming in for follow up. Doing better with addition of cymbalta struggling a lot with move to senior community and husband's alzheimer's disease which is reason for move.   Review of Systems  Constitutional: Negative.   HENT: Negative.    Eyes: Negative.   Respiratory:  Negative for cough, chest tightness and shortness of breath.   Cardiovascular:  Negative for chest pain, palpitations and leg swelling.  Gastrointestinal:  Negative for abdominal distention, abdominal pain, constipation, diarrhea, nausea and vomiting.  Musculoskeletal: Negative.   Skin: Negative.   Neurological: Negative.   Psychiatric/Behavioral:  Positive for decreased concentration, dysphoric mood and sleep disturbance. The patient is nervous/anxious.     Objective:  Physical Exam Constitutional:      Appearance: She is well-developed.  HENT:     Head: Normocephalic and atraumatic.  Cardiovascular:     Rate and Rhythm: Normal rate and regular rhythm.  Pulmonary:     Effort: Pulmonary effort is normal. No respiratory distress.     Breath sounds: Normal breath sounds. No wheezing or rales.  Abdominal:     General: Bowel sounds are normal. There is no distension.     Palpations: Abdomen is soft.     Tenderness: There is no abdominal tenderness. There is no rebound.  Musculoskeletal:     Cervical back: Normal range of motion.  Skin:    General: Skin is warm and dry.  Neurological:     Mental Status: She is alert and oriented to person, place, and time.     Coordination: Coordination normal.     Vitals:   11/22/22 1306  BP: 120/84  Pulse: 91  Temp: 98.6 F (37 C)  TempSrc: Oral  SpO2: 98%  Weight: 153 lb (69.4 kg)  Height: 4' 8.75" (1.441 m)    Assessment & Plan:

## 2022-11-22 NOTE — Patient Instructions (Signed)
For the wellbutrin (bupropion) you can take 300 mg every other day for 2-4 weeks and then stop this one.

## 2022-11-24 DIAGNOSIS — M9902 Segmental and somatic dysfunction of thoracic region: Secondary | ICD-10-CM | POA: Diagnosis not present

## 2022-11-24 DIAGNOSIS — M9905 Segmental and somatic dysfunction of pelvic region: Secondary | ICD-10-CM | POA: Diagnosis not present

## 2022-11-24 DIAGNOSIS — S29012A Strain of muscle and tendon of back wall of thorax, initial encounter: Secondary | ICD-10-CM | POA: Diagnosis not present

## 2022-11-24 DIAGNOSIS — M9903 Segmental and somatic dysfunction of lumbar region: Secondary | ICD-10-CM | POA: Diagnosis not present

## 2022-11-24 DIAGNOSIS — M47816 Spondylosis without myelopathy or radiculopathy, lumbar region: Secondary | ICD-10-CM | POA: Diagnosis not present

## 2022-11-24 DIAGNOSIS — M5137 Other intervertebral disc degeneration, lumbosacral region: Secondary | ICD-10-CM | POA: Diagnosis not present

## 2022-11-26 NOTE — Assessment & Plan Note (Signed)
Doing well with lunesta 3 mg at bedtime as needed. Refill done today. She is aware not to take concurrent with alprazolam.

## 2022-11-26 NOTE — Assessment & Plan Note (Signed)
Uses alprazolam 0.25 mg daily prn and rare although more often lately due to stress from husband's health and move. Taking wellbutrin 300 mg daily and continue cymbalta which we have increased to 60 mg daily. Will continue another 1-2 months and if needed can increase cymbalta further. We plan to stop wellbutrin as this has not been helping. 300 mg every other day for 2-4 weeks then stop.

## 2022-11-29 ENCOUNTER — Encounter (INDEPENDENT_AMBULATORY_CARE_PROVIDER_SITE_OTHER): Payer: Medicare Other | Admitting: Ophthalmology

## 2022-11-29 DIAGNOSIS — H353122 Nonexudative age-related macular degeneration, left eye, intermediate dry stage: Secondary | ICD-10-CM

## 2022-11-29 DIAGNOSIS — I10 Essential (primary) hypertension: Secondary | ICD-10-CM

## 2022-11-29 DIAGNOSIS — H353211 Exudative age-related macular degeneration, right eye, with active choroidal neovascularization: Secondary | ICD-10-CM | POA: Diagnosis not present

## 2022-11-29 DIAGNOSIS — H43813 Vitreous degeneration, bilateral: Secondary | ICD-10-CM | POA: Diagnosis not present

## 2022-11-29 DIAGNOSIS — H35033 Hypertensive retinopathy, bilateral: Secondary | ICD-10-CM

## 2022-12-01 DIAGNOSIS — M5137 Other intervertebral disc degeneration, lumbosacral region: Secondary | ICD-10-CM | POA: Diagnosis not present

## 2022-12-01 DIAGNOSIS — M47816 Spondylosis without myelopathy or radiculopathy, lumbar region: Secondary | ICD-10-CM | POA: Diagnosis not present

## 2022-12-01 DIAGNOSIS — M9905 Segmental and somatic dysfunction of pelvic region: Secondary | ICD-10-CM | POA: Diagnosis not present

## 2022-12-01 DIAGNOSIS — S29012A Strain of muscle and tendon of back wall of thorax, initial encounter: Secondary | ICD-10-CM | POA: Diagnosis not present

## 2022-12-01 DIAGNOSIS — M9902 Segmental and somatic dysfunction of thoracic region: Secondary | ICD-10-CM | POA: Diagnosis not present

## 2022-12-01 DIAGNOSIS — M9903 Segmental and somatic dysfunction of lumbar region: Secondary | ICD-10-CM | POA: Diagnosis not present

## 2022-12-14 DIAGNOSIS — M9902 Segmental and somatic dysfunction of thoracic region: Secondary | ICD-10-CM | POA: Diagnosis not present

## 2022-12-14 DIAGNOSIS — M9903 Segmental and somatic dysfunction of lumbar region: Secondary | ICD-10-CM | POA: Diagnosis not present

## 2022-12-14 DIAGNOSIS — M9905 Segmental and somatic dysfunction of pelvic region: Secondary | ICD-10-CM | POA: Diagnosis not present

## 2022-12-14 DIAGNOSIS — S29012A Strain of muscle and tendon of back wall of thorax, initial encounter: Secondary | ICD-10-CM | POA: Diagnosis not present

## 2022-12-14 DIAGNOSIS — M5137 Other intervertebral disc degeneration, lumbosacral region: Secondary | ICD-10-CM | POA: Diagnosis not present

## 2022-12-14 DIAGNOSIS — M47816 Spondylosis without myelopathy or radiculopathy, lumbar region: Secondary | ICD-10-CM | POA: Diagnosis not present

## 2022-12-20 DIAGNOSIS — M9902 Segmental and somatic dysfunction of thoracic region: Secondary | ICD-10-CM | POA: Diagnosis not present

## 2022-12-20 DIAGNOSIS — M5137 Other intervertebral disc degeneration, lumbosacral region: Secondary | ICD-10-CM | POA: Diagnosis not present

## 2022-12-20 DIAGNOSIS — S29012A Strain of muscle and tendon of back wall of thorax, initial encounter: Secondary | ICD-10-CM | POA: Diagnosis not present

## 2022-12-20 DIAGNOSIS — M9903 Segmental and somatic dysfunction of lumbar region: Secondary | ICD-10-CM | POA: Diagnosis not present

## 2022-12-20 DIAGNOSIS — M47816 Spondylosis without myelopathy or radiculopathy, lumbar region: Secondary | ICD-10-CM | POA: Diagnosis not present

## 2022-12-20 DIAGNOSIS — M9905 Segmental and somatic dysfunction of pelvic region: Secondary | ICD-10-CM | POA: Diagnosis not present

## 2022-12-27 DIAGNOSIS — M5137 Other intervertebral disc degeneration, lumbosacral region: Secondary | ICD-10-CM | POA: Diagnosis not present

## 2022-12-27 DIAGNOSIS — M9903 Segmental and somatic dysfunction of lumbar region: Secondary | ICD-10-CM | POA: Diagnosis not present

## 2022-12-27 DIAGNOSIS — M9902 Segmental and somatic dysfunction of thoracic region: Secondary | ICD-10-CM | POA: Diagnosis not present

## 2022-12-27 DIAGNOSIS — S29012A Strain of muscle and tendon of back wall of thorax, initial encounter: Secondary | ICD-10-CM | POA: Diagnosis not present

## 2022-12-27 DIAGNOSIS — M47816 Spondylosis without myelopathy or radiculopathy, lumbar region: Secondary | ICD-10-CM | POA: Diagnosis not present

## 2022-12-27 DIAGNOSIS — M9905 Segmental and somatic dysfunction of pelvic region: Secondary | ICD-10-CM | POA: Diagnosis not present

## 2023-01-03 DIAGNOSIS — M5137 Other intervertebral disc degeneration, lumbosacral region: Secondary | ICD-10-CM | POA: Diagnosis not present

## 2023-01-03 DIAGNOSIS — M9903 Segmental and somatic dysfunction of lumbar region: Secondary | ICD-10-CM | POA: Diagnosis not present

## 2023-01-03 DIAGNOSIS — M9905 Segmental and somatic dysfunction of pelvic region: Secondary | ICD-10-CM | POA: Diagnosis not present

## 2023-01-03 DIAGNOSIS — S29012A Strain of muscle and tendon of back wall of thorax, initial encounter: Secondary | ICD-10-CM | POA: Diagnosis not present

## 2023-01-03 DIAGNOSIS — M47816 Spondylosis without myelopathy or radiculopathy, lumbar region: Secondary | ICD-10-CM | POA: Diagnosis not present

## 2023-01-03 DIAGNOSIS — M9902 Segmental and somatic dysfunction of thoracic region: Secondary | ICD-10-CM | POA: Diagnosis not present

## 2023-01-17 ENCOUNTER — Encounter (INDEPENDENT_AMBULATORY_CARE_PROVIDER_SITE_OTHER): Payer: Medicare Other | Admitting: Ophthalmology

## 2023-01-17 DIAGNOSIS — H43813 Vitreous degeneration, bilateral: Secondary | ICD-10-CM | POA: Diagnosis not present

## 2023-01-17 DIAGNOSIS — M9903 Segmental and somatic dysfunction of lumbar region: Secondary | ICD-10-CM | POA: Diagnosis not present

## 2023-01-17 DIAGNOSIS — M9902 Segmental and somatic dysfunction of thoracic region: Secondary | ICD-10-CM | POA: Diagnosis not present

## 2023-01-17 DIAGNOSIS — M9905 Segmental and somatic dysfunction of pelvic region: Secondary | ICD-10-CM | POA: Diagnosis not present

## 2023-01-17 DIAGNOSIS — I10 Essential (primary) hypertension: Secondary | ICD-10-CM | POA: Diagnosis not present

## 2023-01-17 DIAGNOSIS — M5137 Other intervertebral disc degeneration, lumbosacral region: Secondary | ICD-10-CM | POA: Diagnosis not present

## 2023-01-17 DIAGNOSIS — H35033 Hypertensive retinopathy, bilateral: Secondary | ICD-10-CM

## 2023-01-17 DIAGNOSIS — H353122 Nonexudative age-related macular degeneration, left eye, intermediate dry stage: Secondary | ICD-10-CM | POA: Diagnosis not present

## 2023-01-17 DIAGNOSIS — S29012A Strain of muscle and tendon of back wall of thorax, initial encounter: Secondary | ICD-10-CM | POA: Diagnosis not present

## 2023-01-17 DIAGNOSIS — M47816 Spondylosis without myelopathy or radiculopathy, lumbar region: Secondary | ICD-10-CM | POA: Diagnosis not present

## 2023-01-17 DIAGNOSIS — H353211 Exudative age-related macular degeneration, right eye, with active choroidal neovascularization: Secondary | ICD-10-CM

## 2023-01-20 DIAGNOSIS — Z23 Encounter for immunization: Secondary | ICD-10-CM | POA: Diagnosis not present

## 2023-02-03 DIAGNOSIS — D692 Other nonthrombocytopenic purpura: Secondary | ICD-10-CM | POA: Diagnosis not present

## 2023-02-03 DIAGNOSIS — L720 Epidermal cyst: Secondary | ICD-10-CM | POA: Diagnosis not present

## 2023-02-03 DIAGNOSIS — D2262 Melanocytic nevi of left upper limb, including shoulder: Secondary | ICD-10-CM | POA: Diagnosis not present

## 2023-02-03 DIAGNOSIS — D2272 Melanocytic nevi of left lower limb, including hip: Secondary | ICD-10-CM | POA: Diagnosis not present

## 2023-02-03 DIAGNOSIS — Z85828 Personal history of other malignant neoplasm of skin: Secondary | ICD-10-CM | POA: Diagnosis not present

## 2023-02-03 DIAGNOSIS — L814 Other melanin hyperpigmentation: Secondary | ICD-10-CM | POA: Diagnosis not present

## 2023-02-03 DIAGNOSIS — D2271 Melanocytic nevi of right lower limb, including hip: Secondary | ICD-10-CM | POA: Diagnosis not present

## 2023-02-03 DIAGNOSIS — D225 Melanocytic nevi of trunk: Secondary | ICD-10-CM | POA: Diagnosis not present

## 2023-02-03 DIAGNOSIS — D1801 Hemangioma of skin and subcutaneous tissue: Secondary | ICD-10-CM | POA: Diagnosis not present

## 2023-02-03 DIAGNOSIS — D2239 Melanocytic nevi of other parts of face: Secondary | ICD-10-CM | POA: Diagnosis not present

## 2023-02-03 DIAGNOSIS — L821 Other seborrheic keratosis: Secondary | ICD-10-CM | POA: Diagnosis not present

## 2023-02-09 DIAGNOSIS — M5137 Other intervertebral disc degeneration, lumbosacral region with discogenic back pain only: Secondary | ICD-10-CM | POA: Diagnosis not present

## 2023-02-09 DIAGNOSIS — S29012A Strain of muscle and tendon of back wall of thorax, initial encounter: Secondary | ICD-10-CM | POA: Diagnosis not present

## 2023-02-09 DIAGNOSIS — M47816 Spondylosis without myelopathy or radiculopathy, lumbar region: Secondary | ICD-10-CM | POA: Diagnosis not present

## 2023-02-09 DIAGNOSIS — M9902 Segmental and somatic dysfunction of thoracic region: Secondary | ICD-10-CM | POA: Diagnosis not present

## 2023-02-09 DIAGNOSIS — M9903 Segmental and somatic dysfunction of lumbar region: Secondary | ICD-10-CM | POA: Diagnosis not present

## 2023-02-09 DIAGNOSIS — M9905 Segmental and somatic dysfunction of pelvic region: Secondary | ICD-10-CM | POA: Diagnosis not present

## 2023-02-25 ENCOUNTER — Telehealth: Payer: Self-pay | Admitting: Internal Medicine

## 2023-02-25 DIAGNOSIS — Z23 Encounter for immunization: Secondary | ICD-10-CM | POA: Diagnosis not present

## 2023-02-25 NOTE — Telephone Encounter (Signed)
Patient needs a letter from you stating that she is for Deniece Portela who has alzheimer.  Please call patient and let her know when she could get this.  [phone]

## 2023-02-28 NOTE — Telephone Encounter (Signed)
I don't see what she is requesting and if a note regarding [facility] should be in his chart not hers. Thanks

## 2023-02-28 NOTE — Telephone Encounter (Signed)
I ma closing this encounter since I have made a new encounter in the patients chart

## 2023-03-01 ENCOUNTER — Encounter: Payer: Self-pay | Admitting: Internal Medicine

## 2023-03-07 ENCOUNTER — Encounter (INDEPENDENT_AMBULATORY_CARE_PROVIDER_SITE_OTHER): Payer: Medicare Other | Admitting: Ophthalmology

## 2023-03-07 DIAGNOSIS — H353211 Exudative age-related macular degeneration, right eye, with active choroidal neovascularization: Secondary | ICD-10-CM | POA: Diagnosis not present

## 2023-03-07 DIAGNOSIS — H353122 Nonexudative age-related macular degeneration, left eye, intermediate dry stage: Secondary | ICD-10-CM

## 2023-03-07 DIAGNOSIS — I10 Essential (primary) hypertension: Secondary | ICD-10-CM | POA: Diagnosis not present

## 2023-03-07 DIAGNOSIS — H43813 Vitreous degeneration, bilateral: Secondary | ICD-10-CM

## 2023-03-07 DIAGNOSIS — H35033 Hypertensive retinopathy, bilateral: Secondary | ICD-10-CM | POA: Diagnosis not present

## 2023-03-08 ENCOUNTER — Other Ambulatory Visit: Payer: Self-pay

## 2023-03-08 MED ORDER — ESZOPICLONE 3 MG PO TABS: 3.0000 mg | ORAL_TABLET | Freq: Every day | ORAL | 5 refills | Status: DC

## 2023-03-29 DIAGNOSIS — H40013 Open angle with borderline findings, low risk, bilateral: Secondary | ICD-10-CM | POA: Diagnosis not present

## 2023-04-11 DIAGNOSIS — Z6831 Body mass index (BMI) 31.0-31.9, adult: Secondary | ICD-10-CM | POA: Diagnosis not present

## 2023-04-11 DIAGNOSIS — Z01419 Encounter for gynecological examination (general) (routine) without abnormal findings: Secondary | ICD-10-CM | POA: Diagnosis not present

## 2023-04-11 DIAGNOSIS — Z1231 Encounter for screening mammogram for malignant neoplasm of breast: Secondary | ICD-10-CM | POA: Diagnosis not present

## 2023-04-11 DIAGNOSIS — L292 Pruritus vulvae: Secondary | ICD-10-CM | POA: Diagnosis not present

## 2023-04-19 DIAGNOSIS — H40013 Open angle with borderline findings, low risk, bilateral: Secondary | ICD-10-CM | POA: Diagnosis not present

## 2023-04-25 ENCOUNTER — Encounter (INDEPENDENT_AMBULATORY_CARE_PROVIDER_SITE_OTHER): Payer: Medicare Other | Admitting: Ophthalmology

## 2023-04-25 DIAGNOSIS — H353122 Nonexudative age-related macular degeneration, left eye, intermediate dry stage: Secondary | ICD-10-CM | POA: Diagnosis not present

## 2023-04-25 DIAGNOSIS — H353211 Exudative age-related macular degeneration, right eye, with active choroidal neovascularization: Secondary | ICD-10-CM

## 2023-04-25 DIAGNOSIS — H35033 Hypertensive retinopathy, bilateral: Secondary | ICD-10-CM

## 2023-04-25 DIAGNOSIS — H43813 Vitreous degeneration, bilateral: Secondary | ICD-10-CM

## 2023-04-25 DIAGNOSIS — I10 Essential (primary) hypertension: Secondary | ICD-10-CM

## 2023-05-10 DIAGNOSIS — M25552 Pain in left hip: Secondary | ICD-10-CM | POA: Diagnosis not present

## 2023-05-10 DIAGNOSIS — M51361 Other intervertebral disc degeneration, lumbar region with lower extremity pain only: Secondary | ICD-10-CM | POA: Diagnosis not present

## 2023-05-16 DIAGNOSIS — M25552 Pain in left hip: Secondary | ICD-10-CM | POA: Diagnosis not present

## 2023-05-16 DIAGNOSIS — H04223 Epiphora due to insufficient drainage, bilateral lacrimal glands: Secondary | ICD-10-CM | POA: Diagnosis not present

## 2023-05-16 DIAGNOSIS — H40013 Open angle with borderline findings, low risk, bilateral: Secondary | ICD-10-CM | POA: Diagnosis not present

## 2023-05-16 DIAGNOSIS — M6281 Muscle weakness (generalized): Secondary | ICD-10-CM | POA: Diagnosis not present

## 2023-05-16 DIAGNOSIS — R262 Difficulty in walking, not elsewhere classified: Secondary | ICD-10-CM | POA: Diagnosis not present

## 2023-05-19 ENCOUNTER — Other Ambulatory Visit: Payer: Self-pay | Admitting: Internal Medicine

## 2023-05-19 ENCOUNTER — Ambulatory Visit (INDEPENDENT_AMBULATORY_CARE_PROVIDER_SITE_OTHER): Payer: Medicare Other | Admitting: Internal Medicine

## 2023-05-19 ENCOUNTER — Other Ambulatory Visit (INDEPENDENT_AMBULATORY_CARE_PROVIDER_SITE_OTHER): Payer: Medicare Other

## 2023-05-19 ENCOUNTER — Encounter: Payer: Self-pay | Admitting: Internal Medicine

## 2023-05-19 VITALS — BP 118/80 | HR 80 | Temp 97.7°F | Ht <= 58 in | Wt 160.0 lb

## 2023-05-19 DIAGNOSIS — I10 Essential (primary) hypertension: Secondary | ICD-10-CM

## 2023-05-19 DIAGNOSIS — Z85038 Personal history of other malignant neoplasm of large intestine: Secondary | ICD-10-CM

## 2023-05-19 DIAGNOSIS — F4323 Adjustment disorder with mixed anxiety and depressed mood: Secondary | ICD-10-CM | POA: Diagnosis not present

## 2023-05-19 DIAGNOSIS — F5104 Psychophysiologic insomnia: Secondary | ICD-10-CM

## 2023-05-19 DIAGNOSIS — C189 Malignant neoplasm of colon, unspecified: Secondary | ICD-10-CM

## 2023-05-19 DIAGNOSIS — K219 Gastro-esophageal reflux disease without esophagitis: Secondary | ICD-10-CM

## 2023-05-19 LAB — COMPREHENSIVE METABOLIC PANEL
ALT: 24 U/L (ref 0–35)
AST: 24 U/L (ref 0–37)
Albumin: 4.3 g/dL (ref 3.5–5.2)
Alkaline Phosphatase: 86 U/L (ref 39–117)
BUN: 17 mg/dL (ref 6–23)
CO2: 26 meq/L (ref 19–32)
Calcium: 9.3 mg/dL (ref 8.4–10.5)
Chloride: 105 meq/L (ref 96–112)
Creatinine, Ser: 0.56 mg/dL (ref 0.40–1.20)
GFR: 92.63 mL/min (ref >60.00)
Glucose, Bld: 91 mg/dL (ref 70–99)
Potassium: 3.9 meq/L (ref 3.5–5.1)
Sodium: 139 meq/L (ref 135–145)
Total Bilirubin: 0.6 mg/dL (ref 0.2–1.2)
Total Protein: 6.9 g/dL (ref 6.0–8.3)

## 2023-05-19 LAB — CBC
HCT: 42.7 % (ref 36.0–46.0)
Hemoglobin: 14.3 g/dL (ref 12.0–15.0)
MCHC: 33.5 g/dL (ref 30.0–36.0)
MCV: 88.5 fL (ref 78.0–100.0)
Platelets: 274 10*3/uL (ref 150.0–400.0)
RBC: 4.83 Mil/uL (ref 3.87–5.11)
RDW: 13.8 % (ref 11.5–15.5)
WBC: 4.9 10*3/uL (ref 4.0–10.5)

## 2023-05-19 LAB — LIPID PANEL
Cholesterol: 228 mg/dL — ABNORMAL HIGH (ref 0–200)
HDL: 66.2 mg/dL (ref >39.00)
LDL Cholesterol: 138 mg/dL — ABNORMAL HIGH (ref 0–99)
NonHDL: 161.81
Total CHOL/HDL Ratio: 3
Triglycerides: 117 mg/dL (ref 0.0–149.0)
VLDL: 23.4 mg/dL (ref 0.0–40.0)

## 2023-05-19 MED ORDER — BUPROPION HCL ER (XL) 300 MG PO TB24: 300.0000 mg | ORAL_TABLET | Freq: Every day | ORAL | 3 refills | Status: DC

## 2023-05-19 MED ORDER — DULOXETINE HCL 60 MG PO CPEP: 60.0000 mg | ORAL_CAPSULE | Freq: Every day | ORAL | 3 refills | Status: DC

## 2023-05-19 MED ORDER — AMLODIPINE BESYLATE-VALSARTAN 5-160 MG PO TABS: 1.0000 | ORAL_TABLET | Freq: Every day | ORAL | 3 refills | Status: DC

## 2023-05-19 MED ORDER — ALPRAZOLAM 0.25 MG PO TABS: 0.2500 mg | ORAL_TABLET | Freq: Two times a day (BID) | ORAL | 3 refills | Status: DC | PRN

## 2023-05-19 MED ORDER — DULOXETINE HCL 30 MG PO CPEP: 30.0000 mg | ORAL_CAPSULE | Freq: Every day | ORAL | 3 refills | Status: DC

## 2023-05-19 MED ORDER — OMEPRAZOLE 20 MG PO CPDR: 20.0000 mg | DELAYED_RELEASE_CAPSULE | Freq: Two times a day (BID) | ORAL | 3 refills | Status: DC

## 2023-05-19 MED ORDER — ESZOPICLONE 3 MG PO TABS: 3.0000 mg | ORAL_TABLET | Freq: Every day | ORAL | 5 refills | Status: DC

## 2023-05-19 NOTE — Patient Instructions (Signed)
 We will increase duloxetine to 90 mg daily.

## 2023-05-19 NOTE — Assessment & Plan Note (Signed)
 Slightly worse than usual and will increase duloxetine to 90 mg daily to help.

## 2023-05-19 NOTE — Assessment & Plan Note (Signed)
 Ordered CEA for follow up.

## 2023-05-19 NOTE — Progress Notes (Signed)
   Subjective:   Patient ID: Erica [name], female    DOB: January 30, 1951, 73 y.o.   MRN: [id number]  Medication Refill Associated symptoms include arthralgias. Pertinent negatives include no abdominal pain, chest pain, coughing, nausea or vomiting.   The patient is a 73 YO female coming in for follow up/medical management see A/P for details.   Review of Systems  Constitutional: Negative.   HENT: Negative.    Eyes: Negative.   Respiratory:  Negative for cough, chest tightness and shortness of breath.   Cardiovascular:  Negative for chest pain, palpitations and leg swelling.  Gastrointestinal:  Negative for abdominal distention, abdominal pain, constipation, diarrhea, nausea and vomiting.  Musculoskeletal:  Positive for arthralgias.  Skin: Negative.   Neurological: Negative.   Psychiatric/Behavioral: Negative.      Objective:  Physical Exam Constitutional:      Appearance: She is well-developed.  HENT:     Head: Normocephalic and atraumatic.  Cardiovascular:     Rate and Rhythm: Normal rate and regular rhythm.  Pulmonary:     Effort: Pulmonary effort is normal. No respiratory distress.     Breath sounds: Normal breath sounds. No wheezing or rales.  Abdominal:     General: Bowel sounds are normal. There is no distension.     Palpations: Abdomen is soft.     Tenderness: There is no abdominal tenderness. There is no rebound.  Musculoskeletal:        General: Tenderness present.     Cervical back: Normal range of motion.  Skin:    General: Skin is warm and dry.  Neurological:     Mental Status: She is alert and oriented to person, place, and time.     Coordination: Coordination normal.     Vitals:   05/19/23 1034  BP: 118/80  Pulse: 80  Temp: 97.7 F (36.5 C)  TempSrc: Oral  SpO2: 93%  Weight: 160 lb (72.6 kg)  Height: 4' 8.75 (1.441 m)    Assessment & Plan:

## 2023-05-19 NOTE — Assessment & Plan Note (Signed)
 BP at goal ordered labs done today to check CMP and adjust as needed amlodipine/valsartan.

## 2023-05-19 NOTE — Assessment & Plan Note (Signed)
 Using lunesta 3 mg at bedtime several times a week and alprazolam 0.5 mg at bedtime a few times a week. Discussion benefit and risk and she would like to continue refilled.

## 2023-05-20 DIAGNOSIS — R262 Difficulty in walking, not elsewhere classified: Secondary | ICD-10-CM | POA: Diagnosis not present

## 2023-05-20 DIAGNOSIS — M25552 Pain in left hip: Secondary | ICD-10-CM | POA: Diagnosis not present

## 2023-05-20 DIAGNOSIS — M6281 Muscle weakness (generalized): Secondary | ICD-10-CM | POA: Diagnosis not present

## 2023-05-23 DIAGNOSIS — R262 Difficulty in walking, not elsewhere classified: Secondary | ICD-10-CM | POA: Diagnosis not present

## 2023-05-23 DIAGNOSIS — M25552 Pain in left hip: Secondary | ICD-10-CM | POA: Diagnosis not present

## 2023-05-23 DIAGNOSIS — M6281 Muscle weakness (generalized): Secondary | ICD-10-CM | POA: Diagnosis not present

## 2023-05-23 LAB — CEA: CEA: <2 ng/mL

## 2023-05-25 ENCOUNTER — Encounter: Payer: Self-pay | Admitting: Internal Medicine

## 2023-05-26 DIAGNOSIS — M25552 Pain in left hip: Secondary | ICD-10-CM | POA: Diagnosis not present

## 2023-05-26 DIAGNOSIS — R262 Difficulty in walking, not elsewhere classified: Secondary | ICD-10-CM | POA: Diagnosis not present

## 2023-05-26 DIAGNOSIS — M6281 Muscle weakness (generalized): Secondary | ICD-10-CM | POA: Diagnosis not present

## 2023-06-01 DIAGNOSIS — M25552 Pain in left hip: Secondary | ICD-10-CM | POA: Diagnosis not present

## 2023-06-01 DIAGNOSIS — R262 Difficulty in walking, not elsewhere classified: Secondary | ICD-10-CM | POA: Diagnosis not present

## 2023-06-01 DIAGNOSIS — M6281 Muscle weakness (generalized): Secondary | ICD-10-CM | POA: Diagnosis not present

## 2023-06-03 DIAGNOSIS — R262 Difficulty in walking, not elsewhere classified: Secondary | ICD-10-CM | POA: Diagnosis not present

## 2023-06-03 DIAGNOSIS — M25552 Pain in left hip: Secondary | ICD-10-CM | POA: Diagnosis not present

## 2023-06-03 DIAGNOSIS — M6281 Muscle weakness (generalized): Secondary | ICD-10-CM | POA: Diagnosis not present

## 2023-06-08 DIAGNOSIS — M6281 Muscle weakness (generalized): Secondary | ICD-10-CM | POA: Diagnosis not present

## 2023-06-08 DIAGNOSIS — R262 Difficulty in walking, not elsewhere classified: Secondary | ICD-10-CM | POA: Diagnosis not present

## 2023-06-08 DIAGNOSIS — M25552 Pain in left hip: Secondary | ICD-10-CM | POA: Diagnosis not present

## 2023-06-10 DIAGNOSIS — M25552 Pain in left hip: Secondary | ICD-10-CM | POA: Diagnosis not present

## 2023-06-10 DIAGNOSIS — M6281 Muscle weakness (generalized): Secondary | ICD-10-CM | POA: Diagnosis not present

## 2023-06-10 DIAGNOSIS — R262 Difficulty in walking, not elsewhere classified: Secondary | ICD-10-CM | POA: Diagnosis not present

## 2023-06-13 ENCOUNTER — Encounter (INDEPENDENT_AMBULATORY_CARE_PROVIDER_SITE_OTHER): Payer: Medicare Other | Admitting: Ophthalmology

## 2023-06-13 DIAGNOSIS — H353122 Nonexudative age-related macular degeneration, left eye, intermediate dry stage: Secondary | ICD-10-CM

## 2023-06-13 DIAGNOSIS — I10 Essential (primary) hypertension: Secondary | ICD-10-CM | POA: Diagnosis not present

## 2023-06-13 DIAGNOSIS — H353211 Exudative age-related macular degeneration, right eye, with active choroidal neovascularization: Secondary | ICD-10-CM | POA: Diagnosis not present

## 2023-06-13 DIAGNOSIS — H35033 Hypertensive retinopathy, bilateral: Secondary | ICD-10-CM

## 2023-06-13 DIAGNOSIS — H43813 Vitreous degeneration, bilateral: Secondary | ICD-10-CM | POA: Diagnosis not present

## 2023-06-17 DIAGNOSIS — M6281 Muscle weakness (generalized): Secondary | ICD-10-CM | POA: Diagnosis not present

## 2023-06-17 DIAGNOSIS — R262 Difficulty in walking, not elsewhere classified: Secondary | ICD-10-CM | POA: Diagnosis not present

## 2023-06-17 DIAGNOSIS — M25552 Pain in left hip: Secondary | ICD-10-CM | POA: Diagnosis not present

## 2023-06-21 DIAGNOSIS — M51369 Other intervertebral disc degeneration, lumbar region without mention of lumbar back pain or lower extremity pain: Secondary | ICD-10-CM | POA: Diagnosis not present

## 2023-06-21 DIAGNOSIS — M7602 Gluteal tendinitis, left hip: Secondary | ICD-10-CM | POA: Diagnosis not present

## 2023-06-21 DIAGNOSIS — M79604 Pain in right leg: Secondary | ICD-10-CM | POA: Diagnosis not present

## 2023-06-21 DIAGNOSIS — M00161 Pneumococcal arthritis, right knee: Secondary | ICD-10-CM | POA: Diagnosis not present

## 2023-06-22 DIAGNOSIS — M25552 Pain in left hip: Secondary | ICD-10-CM | POA: Diagnosis not present

## 2023-06-22 DIAGNOSIS — R262 Difficulty in walking, not elsewhere classified: Secondary | ICD-10-CM | POA: Diagnosis not present

## 2023-06-22 DIAGNOSIS — M6281 Muscle weakness (generalized): Secondary | ICD-10-CM | POA: Diagnosis not present

## 2023-06-24 DIAGNOSIS — R262 Difficulty in walking, not elsewhere classified: Secondary | ICD-10-CM | POA: Diagnosis not present

## 2023-06-24 DIAGNOSIS — M6281 Muscle weakness (generalized): Secondary | ICD-10-CM | POA: Diagnosis not present

## 2023-06-24 DIAGNOSIS — M25552 Pain in left hip: Secondary | ICD-10-CM | POA: Diagnosis not present

## 2023-06-29 DIAGNOSIS — M6281 Muscle weakness (generalized): Secondary | ICD-10-CM | POA: Diagnosis not present

## 2023-06-29 DIAGNOSIS — M25552 Pain in left hip: Secondary | ICD-10-CM | POA: Diagnosis not present

## 2023-06-29 DIAGNOSIS — R262 Difficulty in walking, not elsewhere classified: Secondary | ICD-10-CM | POA: Diagnosis not present

## 2023-07-01 ENCOUNTER — Encounter: Payer: Self-pay | Admitting: Family Medicine

## 2023-07-01 ENCOUNTER — Telehealth (INDEPENDENT_AMBULATORY_CARE_PROVIDER_SITE_OTHER): Payer: Medicare Other | Admitting: Family Medicine

## 2023-07-01 DIAGNOSIS — J019 Acute sinusitis, unspecified: Secondary | ICD-10-CM | POA: Diagnosis not present

## 2023-07-01 DIAGNOSIS — R051 Acute cough: Secondary | ICD-10-CM

## 2023-07-01 DIAGNOSIS — R262 Difficulty in walking, not elsewhere classified: Secondary | ICD-10-CM | POA: Diagnosis not present

## 2023-07-01 DIAGNOSIS — R062 Wheezing: Secondary | ICD-10-CM

## 2023-07-01 DIAGNOSIS — M25552 Pain in left hip: Secondary | ICD-10-CM | POA: Diagnosis not present

## 2023-07-01 DIAGNOSIS — B9689 Other specified bacterial agents as the cause of diseases classified elsewhere: Secondary | ICD-10-CM | POA: Diagnosis not present

## 2023-07-01 DIAGNOSIS — M6281 Muscle weakness (generalized): Secondary | ICD-10-CM | POA: Diagnosis not present

## 2023-07-01 MED ORDER — ALBUTEROL SULFATE HFA 108 (90 BASE) MCG/ACT IN AERS: 2.0000 | INHALATION_SPRAY | Freq: Four times a day (QID) | RESPIRATORY_TRACT | 3 refills | Status: DC | PRN

## 2023-07-01 MED ORDER — AZITHROMYCIN 250 MG PO TABS: ORAL_TABLET | ORAL | 0 refills | Status: AC

## 2023-07-01 NOTE — Patient Instructions (Signed)
 I have sent in azithromycin for you to take.  Take 2 tablets today, then 1 tablet daily for the next 4 days.  I have sent in an albuterol inhaler for you to use 2 puffs every 4 hours as needed for wheezing.  Follow-up with me for new or worsening symptoms.

## 2023-07-01 NOTE — Progress Notes (Signed)
 Virtual Visit via Video Note  I connected with Erica [name] on 07/01/23 at  2:00 PM EST by a video enabled telemedicine application and verified that I am speaking with the correct person using two identifiers.  Patient Location: Home Provider Location: Office/Clinic  I discussed the limitations, risks, security, and privacy concerns of performing an evaluation and management service by video and the availability of in person appointments. I also discussed with the patient that there may be a patient responsible charge related to this service. The patient expressed understanding and agreed to proceed.  Subjective: PCP: Myrlene Broker, MD  Chief Complaint  Patient presents with   Acute Visit    Ongoing for about 7 days, negative covid test, nasal congestion, itchy eyes and throat. Has tried NyQuil and DayQuil   HPI  73 year old female presents for evaluation of a day history of purulent bloody nasal discharge, sinus pressure. Has been taking Sudafed, Zyrtec with no relief. History of seasonal allergies. Denies abdominal pain, nausea, vomiting, diarrhea, rash, fever, chills, other symptoms. Medical history as outlined below.    ROS: Per HPI  Current Outpatient Medications:    ALPRAZolam (XANAX) 0.25 MG tablet, Take 1 tablet (0.25 mg total) by mouth 2 (two) times daily as needed for anxiety., Disp: 30 tablet, Rfl: 3   amLODipine-valsartan (EXFORGE) 5-160 MG tablet, Take 1 tablet by mouth daily., Disp: 90 tablet, Rfl: 3   azithromycin (ZITHROMAX) 250 MG tablet, Take 2 tablets on day 1, then 1 tablet daily on days 2 through 5, Disp: 6 tablet, Rfl: 0   cetirizine (ZYRTEC) 10 MG tablet, Take 10 mg by mouth daily., Disp: , Rfl:    DULoxetine (CYMBALTA) 30 MG capsule, Take 1 capsule (30 mg total) by mouth daily. Take with 60 mg for total dose of 90 mg daily, Disp: 90 capsule, Rfl: 3   DULoxetine (CYMBALTA) 60 MG capsule, Take 1 capsule (60 mg total) by mouth daily., Disp: 90  capsule, Rfl: 3   Eszopiclone 3 MG TABS, Take 1 tablet (3 mg total) by mouth at bedtime. Take immediately before bedtime, Disp: 30 tablet, Rfl: 5   fluticasone (VERAMYST) 27.5 MCG/SPRAY nasal spray, Place 2 sprays into the nose daily as needed for rhinitis., Disp: , Rfl:    omeprazole (PRILOSEC) 20 MG capsule, Take 1 capsule (20 mg total) by mouth 2 (two) times daily., Disp: 90 capsule, Rfl: 3   albuterol (VENTOLIN HFA) 108 (90 Base) MCG/ACT inhaler, Inhale 2 puffs into the lungs every 6 (six) hours as needed., Disp: 18 g, Rfl: 3  Observations/Objective: Today's Vitals   Physical Exam Vitals and nursing note reviewed.  Constitutional:      General: She is not in acute distress. HENT:     Head: Normocephalic and atraumatic.  Eyes:     Extraocular Movements: Extraocular movements intact.  Pulmonary:     Effort: Pulmonary effort is normal.  Musculoskeletal:     Cervical back: Normal range of motion.  Neurological:     General: No focal deficit present.     Mental Status: She is alert and oriented to person, place, and time.  Psychiatric:        Mood and Affect: Mood normal.        Behavior: Behavior normal.    Assessment and Plan: Acute bacterial sinusitis -     Azithromycin; Take 2 tablets on day 1, then 1 tablet daily on days 2 through 5  Dispense: 6 tablet; Refill: 0  Acute cough -  Azithromycin; Take 2 tablets on day 1, then 1 tablet daily on days 2 through 5  Dispense: 6 tablet; Refill: 0  Wheezing -     Albuterol Sulfate HFA; Inhale 2 puffs into the lungs every 6 (six) hours as needed.  Dispense: 18 g; Refill: 3  12 minutes spent on A/V encounter including face-to-face time, chart review and discussion of therapies and follow-up  Follow Up Instructions: No follow-ups on file.   I discussed the assessment and treatment plan with the patient. The patient was provided an opportunity to ask questions, and all were answered. The patient agreed with the plan and  demonstrated an understanding of the instructions.   The patient was advised to call back or seek an in-person evaluation if the symptoms worsen or if the condition fails to improve as anticipated.  The above assessment and management plan was discussed with the patient. The patient verbalized understanding of and has agreed to the management plan.   Moshe Cipro, FNP

## 2023-07-06 DIAGNOSIS — M6281 Muscle weakness (generalized): Secondary | ICD-10-CM | POA: Diagnosis not present

## 2023-07-06 DIAGNOSIS — M25552 Pain in left hip: Secondary | ICD-10-CM | POA: Diagnosis not present

## 2023-07-06 DIAGNOSIS — R262 Difficulty in walking, not elsewhere classified: Secondary | ICD-10-CM | POA: Diagnosis not present

## 2023-07-08 DIAGNOSIS — M25552 Pain in left hip: Secondary | ICD-10-CM | POA: Diagnosis not present

## 2023-07-08 DIAGNOSIS — R262 Difficulty in walking, not elsewhere classified: Secondary | ICD-10-CM | POA: Diagnosis not present

## 2023-07-08 DIAGNOSIS — M6281 Muscle weakness (generalized): Secondary | ICD-10-CM | POA: Diagnosis not present

## 2023-07-13 DIAGNOSIS — M25552 Pain in left hip: Secondary | ICD-10-CM | POA: Diagnosis not present

## 2023-07-13 DIAGNOSIS — R262 Difficulty in walking, not elsewhere classified: Secondary | ICD-10-CM | POA: Diagnosis not present

## 2023-07-13 DIAGNOSIS — M6281 Muscle weakness (generalized): Secondary | ICD-10-CM | POA: Diagnosis not present

## 2023-07-15 DIAGNOSIS — M6281 Muscle weakness (generalized): Secondary | ICD-10-CM | POA: Diagnosis not present

## 2023-07-15 DIAGNOSIS — R262 Difficulty in walking, not elsewhere classified: Secondary | ICD-10-CM | POA: Diagnosis not present

## 2023-07-15 DIAGNOSIS — M25552 Pain in left hip: Secondary | ICD-10-CM | POA: Diagnosis not present

## 2023-07-19 DIAGNOSIS — M25561 Pain in right knee: Secondary | ICD-10-CM | POA: Diagnosis not present

## 2023-07-19 DIAGNOSIS — M25552 Pain in left hip: Secondary | ICD-10-CM | POA: Diagnosis not present

## 2023-07-19 DIAGNOSIS — M1711 Unilateral primary osteoarthritis, right knee: Secondary | ICD-10-CM | POA: Diagnosis not present

## 2023-07-22 DIAGNOSIS — M6281 Muscle weakness (generalized): Secondary | ICD-10-CM | POA: Diagnosis not present

## 2023-07-22 DIAGNOSIS — M25552 Pain in left hip: Secondary | ICD-10-CM | POA: Diagnosis not present

## 2023-07-22 DIAGNOSIS — R262 Difficulty in walking, not elsewhere classified: Secondary | ICD-10-CM | POA: Diagnosis not present

## 2023-07-29 ENCOUNTER — Telehealth: Admitting: Physician Assistant

## 2023-07-29 ENCOUNTER — Ambulatory Visit: Payer: Self-pay

## 2023-07-29 DIAGNOSIS — J4521 Mild intermittent asthma with (acute) exacerbation: Secondary | ICD-10-CM | POA: Diagnosis not present

## 2023-07-29 MED ORDER — BUDESONIDE-FORMOTEROL FUMARATE 80-4.5 MCG/ACT IN AERO: 2.0000 | INHALATION_SPRAY | Freq: Two times a day (BID) | RESPIRATORY_TRACT | 0 refills | Status: DC

## 2023-07-29 MED ORDER — PREDNISONE 20 MG PO TABS
40.0000 mg | ORAL_TABLET | Freq: Every day | ORAL | 0 refills | Status: DC
Start: 2023-07-29 — End: 2023-08-01

## 2023-07-29 NOTE — Telephone Encounter (Signed)
 1st attempt to contact patient. "Call cannot be completed as dialed" unable to leave a message with patient  Copied From CRM [id number]. Reason for Triage: Patient is wanting to speak with a nurse in regard to her daily inhaler, all of hers are currently outdated - states she's been having trouble with her asthma. Experiencing a bad asthma cough but no other symptoms at this moment.

## 2023-07-29 NOTE — Telephone Encounter (Signed)
 3rd attempt to contact patient. Unable to speak with patient or leave message. Recording of "call cannot be completed as dialed." Will route message to office.  Albuterol inhaler prescription [facility] in [facility] [address] Prescription was written with 2 refills.   Copied From CRM [id number]. Reason for Triage: Patient is wanting to speak with a nurse in regard to her daily inhaler, all of hers are currently outdated - states she's been having trouble with her asthma. Experiencing a bad asthma cough but no other symptoms at this moment.

## 2023-07-29 NOTE — Patient Instructions (Signed)
 Erica [name], thank you for joining Erica Loveless, PA-C for today's virtual visit.  While this provider is not your primary care provider (PCP), if your PCP is located in our provider database this encounter information will be shared with them immediately following your visit.   [name] MyChart account gives you access to today's visit and all your visits, tests, and labs performed at [facility] " click here if you don't have a [name] MyChart account or go to [email or web address]  Consent: (Patient) Erica [name] provided verbal consent for this virtual visit at the beginning of the encounter.  Current Medications:  Current Outpatient Medications:    budesonide-formoterol (SYMBICORT) 80-4.5 MCG/ACT inhaler, Inhale 2 puffs into the lungs in the morning and at bedtime., Disp: 1 each, Rfl: 0   predniSONE (DELTASONE) 20 MG tablet, Take 2 tablets (40 mg total) by mouth daily with breakfast., Disp: 10 tablet, Rfl: 0   albuterol (VENTOLIN HFA) 108 (90 Base) MCG/ACT inhaler, Inhale 2 puffs into the lungs every 6 (six) hours as needed., Disp: 18 g, Rfl: 3   ALPRAZolam (XANAX) 0.25 MG tablet, Take 1 tablet (0.25 mg total) by mouth 2 (two) times daily as needed for anxiety., Disp: 30 tablet, Rfl: 3   amLODipine-valsartan (EXFORGE) 5-160 MG tablet, Take 1 tablet by mouth daily., Disp: 90 tablet, Rfl: 3   cetirizine (ZYRTEC) 10 MG tablet, Take 10 mg by mouth daily., Disp: , Rfl:    DULoxetine (CYMBALTA) 30 MG capsule, Take 1 capsule (30 mg total) by mouth daily. Take with 60 mg for total dose of 90 mg daily, Disp: 90 capsule, Rfl: 3   DULoxetine (CYMBALTA) 60 MG capsule, Take 1 capsule (60 mg total) by mouth daily., Disp: 90 capsule, Rfl: 3   Eszopiclone 3 MG TABS, Take 1 tablet (3 mg total) by mouth at bedtime. Take immediately before bedtime, Disp: 30 tablet, Rfl: 5   fluticasone (VERAMYST) 27.5 MCG/SPRAY nasal spray, Place 2 sprays into the nose daily as needed for  rhinitis., Disp: , Rfl:    omeprazole (PRILOSEC) 20 MG capsule, Take 1 capsule (20 mg total) by mouth 2 (two) times daily., Disp: 90 capsule, Rfl: 3   Medications ordered in this encounter:  Meds ordered this encounter  Medications   predniSONE (DELTASONE) 20 MG tablet    Sig: Take 2 tablets (40 mg total) by mouth daily with breakfast.    Dispense:  10 tablet    Refill:  0    Supervising Provider:   Merrilee Jansky [[id number]]   budesonide-formoterol (SYMBICORT) 80-4.5 MCG/ACT inhaler    Sig: Inhale 2 puffs into the lungs in the morning and at bedtime.    Dispense:  1 each    Refill:  0    Supervising Provider:   Merrilee Jansky [id number]     *If you need refills on other medications prior to your next appointment, please contact your pharmacy*  Follow-Up: Call back or seek an in-person evaluation if the symptoms worsen or if the condition fails to improve as anticipated.  [facility] [phone]  Other Instructions Asthma, Adult  Asthma is a long-term (chronic) condition that causes recurrent episodes in which the lower airways in the lungs become tight and narrow. The narrowing is caused by inflammation and tightening of the smooth muscle around the lower airways. Asthma episodes, also called asthma attacks or asthma flares, may cause coughing, making high-pitched whistling sounds when you breathe, most often when you breathe out (  wheezing), shortness of breath, and chest pain. The airways may produce extra mucus caused by the inflammation and irritation. During an attack, it can be difficult to breathe. Asthma attacks can range from minor to life-threatening. Asthma cannot be cured, but medicines and lifestyle changes can help control it and treat acute attacks. It is important to keep your asthma well controlled so the condition does not interfere with your daily life. What are the causes? This condition is believed to be caused by inherited (genetic) and  environmental factors, but its exact cause is not known. What can trigger an asthma attack? Many things can bring on an asthma attack or make symptoms worse. These triggers are different for every person. Common triggers include: Allergens and irritants like mold, dust, pet dander, cockroaches, pollen, air pollution, and chemical odors. Cigarette smoke. Weather changes and cold air. Stress and strong emotional responses such as crying or laughing hard. Certain medications such as aspirin or beta blockers. Infections and inflammatory conditions, such as the flu, a cold, pneumonia, or inflammation of the nasal membranes (rhinitis). Gastroesophageal reflux disease (GERD). What are the signs or symptoms? Symptoms may occur right after exposure to an asthma trigger or hours later and can vary by person. Common signs and symptoms include: Wheezing. Trouble breathing (shortness of breath). Excessive nighttime or early morning coughing. Chest tightness. Tiredness (fatigue) with minimal activity. Difficulty talking in complete sentences. Poor exercise tolerance. How is this diagnosed? This condition is diagnosed based on: A physical exam and your medical history. Tests, which may include: Lung function studies to evaluate the flow of air in your lungs. Allergy tests. Imaging tests, such as X-rays. How is this treated? There is no cure, but symptoms can be controlled with proper treatment. Treatment usually involves: Identifying and avoiding your asthma triggers. Inhaled medicines. Two types are commonly used to treat asthma, depending on severity: Controller medicines. These help prevent asthma symptoms from occurring. They are taken every day. Fast-acting reliever or rescue medicines. These quickly relieve asthma symptoms. They are used as needed and provide short-term relief. Using other medicines, such as: Allergy medicines, such as antihistamines, if your asthma attacks are triggered by  allergens. Immune medicines (immunomodulators). These are medicines that help control the immune system. Using supplemental oxygen. This is only needed during a severe episode. Creating an asthma action plan. An asthma action plan is a written plan for managing and treating your asthma attacks. This plan includes: A list of your asthma triggers and how to avoid them. Information about when medicines should be taken and when their dosage should be changed. Instructions about using a device called a peak flow meter. A peak flow meter measures how well the lungs are working and the severity of your asthma. It helps you monitor your condition. Follow these instructions at home: Take over-the-counter and prescription medicines only as told by your health care provider. Stay up to date on all vaccinations as recommended by your healthcare provider, including vaccines for the flu and pneumonia. Use a peak flow meter and keep track of your peak flow readings. Understand and use your asthma action plan to address any asthma flares. Do not smoke or allow anyone to smoke in your home. Contact a health care provider if: You have wheezing, shortness of breath, or a cough that is not responding to medicines. Your medicines are causing side effects, such as a rash, itching, swelling, or trouble breathing. You need to use a reliever medicine more than 2-3 times a  week. Your peak flow reading is still at 50-79% of your personal best after following your action plan for 1 hour. You have a fever and shortness of breath. Get help right away if: You are getting worse and do not respond to treatment during an asthma attack. You are short of breath when at rest or when doing very little physical activity. You have difficulty eating, drinking, or talking. You have chest pain or tightness. You develop a fast heartbeat or palpitations. You have a bluish color to your lips or fingernails. You are light-headed or  dizzy, or you faint. Your peak flow reading is less than 50% of your personal best. You feel too tired to breathe normally. These symptoms may be an emergency. Get help right away. Call 911. Do not wait to see if the symptoms will go away. Do not drive yourself to the hospital. Summary Asthma is a long-term (chronic) condition that causes recurrent episodes in which the airways become tight and narrow. Asthma episodes, also called asthma attacks or asthma flares, can cause coughing, wheezing, shortness of breath, and chest pain. Asthma cannot be cured, but medicines and lifestyle changes can help keep it well controlled and prevent asthma flares. Make sure you understand how to avoid triggers and how and when to use your medicines. Asthma attacks can range from minor to life-threatening. Get help right away if you have an asthma attack and do not respond to treatment with your usual rescue medicines. This information is not intended to replace advice given to you by your health care provider. Make sure you discuss any questions you have with your health care provider. Document Revised: 02/11/2021 Document Reviewed: 02/02/2021 Elsevier Patient Education  2024 Elsevier Inc.   If you have been instructed to have an in-person evaluation today at a local Urgent Care facility, please use the link below. It will take you to a list of all of our available [facility], including address, phone number and hours of operation. Please do not delay care.  [facility]  If you or a family member do not have a primary care provider, use the link below to schedule a visit and establish care. When you choose a [facility] primary care physician or advanced practice provider, you gain a long-term partner in health. Find a Primary Care Provider  Learn more about [facility]'s in-office and virtual care options: [facility] - Get Care Now

## 2023-07-29 NOTE — Telephone Encounter (Signed)
 Copied From CRM [id number]. Reason for Triage: Patient is wanting to speak with a nurse in regard to her daily inhaler, all of hers are currently outdated - states she's been having trouble with her asthma. Experiencing a bad asthma cough but no other symptoms at this moment.   Call cannot be completed as dialed.

## 2023-07-29 NOTE — Telephone Encounter (Signed)
  Chief Complaint: cough Symptoms: cough Frequency: constant Pertinent Negatives: Patient denies chest pain, difficulty breathing at this time.  Disposition: [] ED /[] Urgent Care (no appt availability in office) / [] Appointment(In office/virtual)/ [x]  [facility]/ [] Home Care/ [] Refused Recommended Disposition /[] [facility]/ []  Follow-up with PCP Additional Notes:  Asthma had been well controlled and she stopped her daily inhalers, they are now expired. For [name] few months now she has been having intermittent asthma flairs. Calling today for constant "asthma cough" with intermittent wheezing.  She has been using rescue inhaler with effect, needed to use in bursts earlier. No wheezing or conversational dyspnea noted on the call. Appointment scheduled for Virtual Urgent Care today at 630pm. Care advice discussed as documented in protocol.    Copied from CRM [id number]. Topic: Clinical - Pink Word Triage >> Jul 29, 2023  9:35 AM Erica [name] wrote: Reason for Triage: Patient is wanting to speak with [name] nurse in regard to her daily inhaler, all of hers are currently outdated - states she's been having trouble with her asthma. Experiencing [name] bad asthma cough but no other symptoms at this moment. Reason for Disposition  [1] Continuous (nonstop) coughing AND [2] keeps from working or sleeping AND [3] not improved after 2 or 3 inhaler or nebulizer treatments given 20 minutes apart  Protocols used: Asthma Attack-[name]-AH

## 2023-07-29 NOTE — Telephone Encounter (Signed)
**Note De-identified  [place] Obfuscation** Please advise 

## 2023-07-29 NOTE — Progress Notes (Signed)
 Virtual Visit Consent   Erica [name], you are scheduled for a virtual visit with a [facility] provider today. Just as with appointments in the office, your consent must be obtained to participate. Your consent will be active for this visit and any virtual visit you may have with one of our providers in the next 365 days. If you have a MyChart account, a copy of this consent can be sent to you electronically.  As this is a virtual visit, video technology does not allow for your provider to perform a traditional examination. This may limit your provider's ability to fully assess your condition. If your provider identifies any concerns that need to be evaluated in person or the need to arrange testing (such as labs, EKG, etc.), we will make arrangements to do so. Although advances in technology are sophisticated, we cannot ensure that it will always work on either your end or our end. If the connection with a video visit is poor, the visit may have to be switched to a telephone visit. With either a video or telephone visit, we are not always able to ensure that we have a secure connection.  By engaging in this virtual visit, you consent to the provision of healthcare and authorize for your insurance to be billed (if applicable) for the services provided during this visit. Depending on your insurance coverage, you may receive a charge related to this service.  I need to obtain your verbal consent now. Are you willing to proceed with your visit today? Franchesca Veneziano has provided verbal consent on 07/29/2023 for a virtual visit (video or telephone). [name], PA-C  Date: 07/29/2023 7:14 PM   Virtual Visit via Video Note   IMargaretann Loveless, connected with  Palak Tercero  ([id number], [birth year]) on 07/29/23 at  6:30 PM EDT by a video-enabled telemedicine application and verified that I am speaking with the correct person using two identifiers.  Location: Patient: Virtual Visit Location Patient:  Home Provider: Virtual Visit Location Provider: Home Office   I discussed the limitations of evaluation and management by telemedicine and the availability of in person appointments. The patient expressed understanding and agreed to proceed.    History of Present Illness: Erica [name] is a 73 y.o. who identifies as a female who was assigned female at birth, and is being seen today for asthma exacerbation.  HPI: Cough This is a new problem. The current episode started in the past 7 days. The problem has been gradually worsening. The problem occurs every few minutes. The cough is Non-productive. Associated symptoms include a sore throat (scratchy), shortness of breath and wheezing. Pertinent negatives include no chills, ear congestion, ear pain, fever, headaches, myalgias, nasal congestion or postnasal drip. Her past medical history is significant for asthma and bronchitis.     Problems:  Patient Active Problem List   Diagnosis Date Noted   Bilateral hip pain 05/18/2022   Adjustment disorder 05/18/2022   Encounter for general adult medical examination with abnormal findings 05/18/2022   Insomnia 02/13/2022   Essential hypertension 02/13/2022   Asthma 02/13/2022   GERD (gastroesophageal reflux disease) 02/13/2022   Lumbar radiculopathy 11/27/2021   Early stage nonexudative age-related macular degeneration of both eyes 09/02/2016   Combined forms of age-related cataract of both eyes 09/02/2016   Hx of colon cancer, stage I 04/15/2015    Allergies:  Allergies  Allergen Reactions   Betadine [Povidone Iodine] Rash    Rash after surgery- likely related to Betadine  Medications:  Current Outpatient Medications:    budesonide-formoterol (SYMBICORT) 80-4.5 MCG/ACT inhaler, Inhale 2 puffs into the lungs in the morning and at bedtime., Disp: 1 each, Rfl: 0   predniSONE (DELTASONE) 20 MG tablet, Take 2 tablets (40 mg total) by mouth daily with breakfast., Disp: 10 tablet, Rfl: 0   albuterol  (VENTOLIN HFA) 108 (90 Base) MCG/ACT inhaler, Inhale 2 puffs into the lungs every 6 (six) hours as needed., Disp: 18 g, Rfl: 3   ALPRAZolam (XANAX) 0.25 MG tablet, Take 1 tablet (0.25 mg total) by mouth 2 (two) times daily as needed for anxiety., Disp: 30 tablet, Rfl: 3   amLODipine-valsartan (EXFORGE) 5-160 MG tablet, Take 1 tablet by mouth daily., Disp: 90 tablet, Rfl: 3   cetirizine (ZYRTEC) 10 MG tablet, Take 10 mg by mouth daily., Disp: , Rfl:    DULoxetine (CYMBALTA) 30 MG capsule, Take 1 capsule (30 mg total) by mouth daily. Take with 60 mg for total dose of 90 mg daily, Disp: 90 capsule, Rfl: 3   DULoxetine (CYMBALTA) 60 MG capsule, Take 1 capsule (60 mg total) by mouth daily., Disp: 90 capsule, Rfl: 3   Eszopiclone 3 MG TABS, Take 1 tablet (3 mg total) by mouth at bedtime. Take immediately before bedtime, Disp: 30 tablet, Rfl: 5   fluticasone (VERAMYST) 27.5 MCG/SPRAY nasal spray, Place 2 sprays into the nose daily as needed for rhinitis., Disp: , Rfl:    omeprazole (PRILOSEC) 20 MG capsule, Take 1 capsule (20 mg total) by mouth 2 (two) times daily., Disp: 90 capsule, Rfl: 3  Observations/Objective: Patient is well-developed, well-nourished in no acute distress.  Resting comfortably at home.  Head is normocephalic, atraumatic.  No labored breathing.  Speech is clear and coherent with logical content.  Patient is alert and oriented at baseline.    Assessment and Plan: 1. Mild intermittent asthma with acute exacerbation (Primary) - predniSONE (DELTASONE) 20 MG tablet; Take 2 tablets (40 mg total) by mouth daily with breakfast.  Dispense: 10 tablet; Refill: 0 - budesonide-formoterol (SYMBICORT) 80-4.5 MCG/ACT inhaler; Inhale 2 puffs into the lungs in the morning and at bedtime.  Dispense: 1 each; Refill: 0  - Worsening asthma symptoms that are not fully relieved with albuterol alone - Add Symbicort ( cost effective vs inhaled corticosteroid alone) - Add Prednisone burst for 5 days -  May use OTC cough suppressant of choice, Mucinex DM recommended - Continue Albuterol as needed - Steam and humidifier can help - Push fluids - Rest as needed - Seek in person evaluation if not improving or if symptoms worsen  Follow Up Instructions: I discussed the assessment and treatment plan with the patient. The patient was provided an opportunity to ask questions and all were answered. The patient agreed with the plan and demonstrated an understanding of the instructions.  A copy of instructions were sent to the patient via MyChart unless otherwise noted below.    The patient was advised to call back or seek an in-person evaluation if the symptoms worsen or if the condition fails to improve as anticipated.    [name], PA-C

## 2023-08-01 ENCOUNTER — Encounter: Payer: Self-pay | Admitting: Family Medicine

## 2023-08-01 ENCOUNTER — Encounter (INDEPENDENT_AMBULATORY_CARE_PROVIDER_SITE_OTHER): Payer: Medicare Other | Admitting: Ophthalmology

## 2023-08-01 ENCOUNTER — Encounter (INDEPENDENT_AMBULATORY_CARE_PROVIDER_SITE_OTHER): Payer: Self-pay

## 2023-08-01 ENCOUNTER — Ambulatory Visit (INDEPENDENT_AMBULATORY_CARE_PROVIDER_SITE_OTHER): Admitting: Family Medicine

## 2023-08-01 VITALS — BP 122/78 | HR 95 | Temp 98.5°F | Ht <= 58 in | Wt 160.2 lb

## 2023-08-01 DIAGNOSIS — J4541 Moderate persistent asthma with (acute) exacerbation: Secondary | ICD-10-CM | POA: Diagnosis not present

## 2023-08-01 MED ORDER — PREDNISONE 20 MG PO TABS: 40.0000 mg | ORAL_TABLET | Freq: Every day | ORAL | 0 refills | Status: AC

## 2023-08-01 MED ORDER — IPRATROPIUM-ALBUTEROL 0.5-2.5 (3) MG/3ML IN SOLN
3.0000 mL | Freq: Once | RESPIRATORY_TRACT | Status: AC
Start: 2023-08-01 — End: 2023-08-01
  Administered 2023-08-01: 3 mL via RESPIRATORY_TRACT

## 2023-08-01 MED ORDER — ALBUTEROL SULFATE (2.5 MG/3ML) 0.083% IN NEBU: 2.5000 mg | INHALATION_SOLUTION | Freq: Four times a day (QID) | RESPIRATORY_TRACT | 1 refills | Status: AC | PRN

## 2023-08-01 NOTE — Patient Instructions (Addendum)
 Extend prednisone for 5 more days.  Continue symbicort preventative inhaler daily as prescribed  I have sent in albuterol solution for you to use in a nebulizer machine to your pharmacy. It may be easiest to purchase a nebulizer machine online.   May use the nebulizer treatments every 4 hours as needed for shortness of breath and wheezing. Do NOT use within 4 hours of using albuterol rescue inhaler.   For the next few days, I would use the nebulizer machine 2-3 times per day on a schedule until we can get you opened up.   Follow-up with me for new or worsening symptoms.

## 2023-08-01 NOTE — Progress Notes (Signed)
 Acute Office Visit  Subjective:     Patient ID: Erica [name], female    DOB: 12/07/1950, 73 y.o.   MRN: [id number]  Chief Complaint  Patient presents with   Acute Visit    Started about a week ago, has had a intermittent cough since January. Wheezing, SOB, coughing. Is regularly using inhalers     HPI Patient is in today for evaluation of conituing cough, wheezing, SOB, for the last 2 weeks. Was seen by [facility] virtually and was treated with symbicort and oral prednisone on 07/29/23. States that this is not helping much. Denies known sick contacts. Denies abdominal pain, nausea, vomiting, diarrhea, rash, fever, chills, other symptoms.  Medical hx as outlined below.  ROS Per HPI      Objective:    BP 122/78 (BP Location: Left Arm, Patient Position: Sitting)   Pulse 95   Temp 98.5 F (36.9 C) (Temporal)   Ht 4' 8.75" (1.441 m)   Wt 160 lb 3.2 oz (72.7 kg)   SpO2 96%   BMI 34.97 kg/m    Physical Exam Vitals and nursing note reviewed.  Constitutional:      General: She is not in acute distress. HENT:     Head: Normocephalic and atraumatic.     Nose: No congestion.     Mouth/Throat:     Mouth: Mucous membranes are moist.     Pharynx: Oropharynx is clear. No oropharyngeal exudate or posterior oropharyngeal erythema.  Eyes:     Extraocular Movements: Extraocular movements intact.  Cardiovascular:     Rate and Rhythm: Normal rate and regular rhythm.  Pulmonary:     Effort: Pulmonary effort is normal. No respiratory distress.     Breath sounds: Wheezing present. No rhonchi or rales.     Comments: Diffuse wheezing, dry cough Musculoskeletal:     Cervical back: Normal range of motion and neck supple.  Lymphadenopathy:     Cervical: No cervical adenopathy.  Skin:    General: Skin is warm and dry.  Neurological:     Mental Status: She is alert.   Lung exam prior to neb tx: dry cough, diffuse wheezing O2 sat:96 HR:95 Lung sounds post neb tx: clear upper lobes, dry  cough, wheezing to bilat lower lobes  O2 sat: 97   HR: 86  Patient tolerated procedure well, good results, decreased work of breathing    No results found for any visits on 08/01/23.      Assessment & Plan:   Moderate persistent asthma with acute exacerbation -     predniSONE; Take 2 tablets (40 mg total) by mouth daily for 5 days.  Dispense: 10 tablet; Refill: 0 -     Albuterol Sulfate; Take 3 mLs (2.5 mg total) by nebulization every 6 (six) hours as needed for wheezing or shortness of breath.  Dispense: 150 mL; Refill: 1 -     Ipratropium-Albuterol     Meds ordered this encounter  Medications   predniSONE (DELTASONE) 20 MG tablet    Sig: Take 2 tablets (40 mg total) by mouth daily for 5 days.    Dispense:  10 tablet    Refill:  0   albuterol (PROVENTIL) (2.5 MG/3ML) 0.083% nebulizer solution    Sig: Take 3 mLs (2.5 mg total) by nebulization every 6 (six) hours as needed for wheezing or shortness of breath.    Dispense:  150 mL    Refill:  1   ipratropium-albuterol (DUONEB) 0.5-2.5 (3) MG/3ML nebulizer solution 3 mL  Return in 2 days (on 08/03/2023) for recheck asthma.  Moshe Cipro, FNP

## 2023-08-02 NOTE — Telephone Encounter (Signed)
 FYI

## 2023-08-03 ENCOUNTER — Encounter: Payer: Self-pay | Admitting: Family Medicine

## 2023-08-03 ENCOUNTER — Ambulatory Visit (INDEPENDENT_AMBULATORY_CARE_PROVIDER_SITE_OTHER): Admitting: Family Medicine

## 2023-08-03 ENCOUNTER — Ambulatory Visit (INDEPENDENT_AMBULATORY_CARE_PROVIDER_SITE_OTHER)

## 2023-08-03 VITALS — BP 130/78 | HR 100 | Temp 98.0°F | Ht <= 58 in | Wt 160.0 lb

## 2023-08-03 DIAGNOSIS — R0602 Shortness of breath: Secondary | ICD-10-CM

## 2023-08-03 DIAGNOSIS — R051 Acute cough: Secondary | ICD-10-CM

## 2023-08-03 DIAGNOSIS — J069 Acute upper respiratory infection, unspecified: Secondary | ICD-10-CM

## 2023-08-03 DIAGNOSIS — R062 Wheezing: Secondary | ICD-10-CM

## 2023-08-03 DIAGNOSIS — R059 Cough, unspecified: Secondary | ICD-10-CM | POA: Diagnosis not present

## 2023-08-03 DIAGNOSIS — B9689 Other specified bacterial agents as the cause of diseases classified elsewhere: Secondary | ICD-10-CM

## 2023-08-03 LAB — COMPREHENSIVE METABOLIC PANEL WITH GFR
ALT: 34 U/L (ref 0–35)
AST: 22 U/L (ref 0–37)
Albumin: 4.3 g/dL (ref 3.5–5.2)
Alkaline Phosphatase: 84 U/L (ref 39–117)
BUN: 22 mg/dL (ref 6–23)
CO2: 23 meq/L (ref 19–32)
Calcium: 9.2 mg/dL (ref 8.4–10.5)
Chloride: 104 meq/L (ref 96–112)
Creatinine, Ser: 0.63 mg/dL (ref 0.40–1.20)
GFR: 89.9 mL/min (ref >60.00)
Glucose, Bld: 150 mg/dL — ABNORMAL HIGH (ref 70–99)
Potassium: 4 meq/L (ref 3.5–5.1)
Sodium: 137 meq/L (ref 135–145)
Total Bilirubin: 0.3 mg/dL (ref 0.2–1.2)
Total Protein: 7.4 g/dL (ref 6.0–8.3)

## 2023-08-03 MED ORDER — DOXYCYCLINE HYCLATE 100 MG PO CAPS: 100.0000 mg | ORAL_CAPSULE | Freq: Two times a day (BID) | ORAL | 0 refills | Status: AC

## 2023-08-03 NOTE — Patient Instructions (Addendum)
 We are checking labs today, will be in contact with any results that require further attention  We are getting an xray today. We will be in contact with any abnormal results that require further attention.  Continue nebulizer treatments  Continue oral steroid course  Follow-up with me for new or worsening symptoms.

## 2023-08-03 NOTE — Progress Notes (Signed)
 Acute Office Visit  Subjective:     Patient ID: Erica [name], female    DOB: [birth year], 73 y.o.   MRN: [id number]  No chief complaint on file.   HPI Patient is in today for recheck of asthma. Was seen by me on August 01, 2023, was given DuoNeb treatment in the office with good benefit. Has been using albuterol nebulizer at home three times per day with oral steroids.  She is not feeling better than she was when she left here 2 days ago.  Reports continued SOB, now with central chest pain with coughing, continued fatigue, worsening cough at night.  Denies abdominal pain, nausea, vomiting, diarrhea, rash, fever, chills, other symptoms. Medical history as outlined below.    ROS Per HPI      Objective:    BP 130/78 (BP Location: Left Arm, Patient Position: Sitting)   Pulse 100   Temp 98 F (36.7 C) (Temporal)   Ht 4' 8.75" (1.441 m)   Wt 160 lb (72.6 kg)   SpO2 95%   BMI 34.93 kg/m    Physical Exam Vitals and nursing note reviewed.  Constitutional:      General: She is not in acute distress.    Appearance: She is normal weight.     Comments: Appears fatigued  HENT:     Head: Normocephalic and atraumatic.     Nose: Nose normal.  Eyes:     Extraocular Movements: Extraocular movements intact.     Pupils: Pupils are equal, round, and reactive to light.  Cardiovascular:     Rate and Rhythm: Normal rate and regular rhythm.     Heart sounds: Normal heart sounds.  Pulmonary:     Effort: Pulmonary effort is normal. No respiratory distress.     Breath sounds: No stridor. Wheezing and rhonchi present. No rales.  Chest:     Chest wall: Tenderness present.  Musculoskeletal:        General: Normal range of motion.     Cervical back: Normal range of motion.  Lymphadenopathy:     Cervical: No cervical adenopathy.  Neurological:     General: No focal deficit present.     Mental Status: She is alert and oriented to person, place, and time.  Psychiatric:        Mood and  Affect: Mood normal.        Thought Content: Thought content normal.    No results found for any visits on 08/03/23.      Assessment & Plan:   Bacterial URI -     CBC with Differential/Platelet -     DG Chest 2 View; Future -     Doxycycline Hyclate; Take 1 capsule (100 mg total) by mouth 2 (two) times daily for 7 days.  Dispense: 14 capsule; Refill: 0  Acute cough -     CBC with Differential/Platelet -     DG Chest 2 View; Future  Wheezing -     CBC with Differential/Platelet -     DG Chest 2 View; Future  SOB (shortness of breath) -     CBC with Differential/Platelet -     Comprehensive metabolic panel -     D-dimer, quantitative -     DG Chest 2 View; Future  Given new onset of chest pain with coughing and continued shortness of breath, will run D-dimer to help rule out PE Continue steroids Continue nebulizer treatments Will send doxycycline twice daily x 7 days for bacterial URI  Meds ordered this encounter  Medications   doxycycline (VIBRAMYCIN) 100 MG capsule    Sig: Take 1 capsule (100 mg total) by mouth 2 (two) times daily for 7 days.    Dispense:  14 capsule    Refill:  0    Return if symptoms worsen or fail to improve.  Moshe Cipro, FNP

## 2023-08-04 LAB — CBC WITH DIFFERENTIAL/PLATELET
Basophils Absolute: 0 10*3/uL (ref 0.0–0.1)
Basophils Relative: 0.2 % (ref 0.0–3.0)
Eosinophils Absolute: 0 10*3/uL (ref 0.0–0.7)
Eosinophils Relative: 0 % (ref 0.0–5.0)
HCT: 44.1 % (ref 36.0–46.0)
Hemoglobin: 14.5 g/dL (ref 12.0–15.0)
Lymphocytes Relative: 8.3 % — ABNORMAL LOW (ref 12.0–46.0)
Lymphs Abs: 0.8 10*3/uL (ref 0.7–4.0)
MCHC: 32.8 g/dL (ref 30.0–36.0)
MCV: 87.9 fl (ref 78.0–100.0)
Monocytes Absolute: 0.3 10*3/uL (ref 0.1–1.0)
Monocytes Relative: 3 % (ref 3.0–12.0)
Neutro Abs: 8.7 10*3/uL — ABNORMAL HIGH (ref 1.4–7.7)
Neutrophils Relative %: 88.5 % — ABNORMAL HIGH (ref 43.0–77.0)
Platelets: 342 10*3/uL (ref 150.0–400.0)
RBC: 5.02 Mil/uL (ref 3.87–5.11)
RDW: 13.9 % (ref 11.5–15.5)
WBC: 9.9 10*3/uL (ref 4.0–10.5)

## 2023-08-04 LAB — D-DIMER, QUANTITATIVE: D-Dimer, Quant: <0.19 ug{FEU}/mL (ref <0.50)

## 2023-08-05 DIAGNOSIS — M6281 Muscle weakness (generalized): Secondary | ICD-10-CM | POA: Diagnosis not present

## 2023-08-05 DIAGNOSIS — M25552 Pain in left hip: Secondary | ICD-10-CM | POA: Diagnosis not present

## 2023-08-05 DIAGNOSIS — R262 Difficulty in walking, not elsewhere classified: Secondary | ICD-10-CM | POA: Diagnosis not present

## 2023-08-08 ENCOUNTER — Encounter (INDEPENDENT_AMBULATORY_CARE_PROVIDER_SITE_OTHER): Admitting: Ophthalmology

## 2023-08-08 ENCOUNTER — Encounter: Payer: Self-pay | Admitting: Family Medicine

## 2023-08-10 DIAGNOSIS — M25552 Pain in left hip: Secondary | ICD-10-CM | POA: Diagnosis not present

## 2023-08-10 DIAGNOSIS — M6281 Muscle weakness (generalized): Secondary | ICD-10-CM | POA: Diagnosis not present

## 2023-08-10 DIAGNOSIS — R262 Difficulty in walking, not elsewhere classified: Secondary | ICD-10-CM | POA: Diagnosis not present

## 2023-08-12 DIAGNOSIS — R262 Difficulty in walking, not elsewhere classified: Secondary | ICD-10-CM | POA: Diagnosis not present

## 2023-08-12 DIAGNOSIS — M6281 Muscle weakness (generalized): Secondary | ICD-10-CM | POA: Diagnosis not present

## 2023-08-12 DIAGNOSIS — M25552 Pain in left hip: Secondary | ICD-10-CM | POA: Diagnosis not present

## 2023-08-16 ENCOUNTER — Encounter (INDEPENDENT_AMBULATORY_CARE_PROVIDER_SITE_OTHER): Admitting: Ophthalmology

## 2023-08-16 DIAGNOSIS — H35033 Hypertensive retinopathy, bilateral: Secondary | ICD-10-CM | POA: Diagnosis not present

## 2023-08-16 DIAGNOSIS — R262 Difficulty in walking, not elsewhere classified: Secondary | ICD-10-CM | POA: Diagnosis not present

## 2023-08-16 DIAGNOSIS — I10 Essential (primary) hypertension: Secondary | ICD-10-CM

## 2023-08-16 DIAGNOSIS — M25552 Pain in left hip: Secondary | ICD-10-CM | POA: Diagnosis not present

## 2023-08-16 DIAGNOSIS — H353122 Nonexudative age-related macular degeneration, left eye, intermediate dry stage: Secondary | ICD-10-CM

## 2023-08-16 DIAGNOSIS — H353211 Exudative age-related macular degeneration, right eye, with active choroidal neovascularization: Secondary | ICD-10-CM

## 2023-08-16 DIAGNOSIS — H43813 Vitreous degeneration, bilateral: Secondary | ICD-10-CM | POA: Diagnosis not present

## 2023-08-16 DIAGNOSIS — M6281 Muscle weakness (generalized): Secondary | ICD-10-CM | POA: Diagnosis not present

## 2023-08-19 DIAGNOSIS — R262 Difficulty in walking, not elsewhere classified: Secondary | ICD-10-CM | POA: Diagnosis not present

## 2023-08-19 DIAGNOSIS — M25552 Pain in left hip: Secondary | ICD-10-CM | POA: Diagnosis not present

## 2023-08-19 DIAGNOSIS — M6281 Muscle weakness (generalized): Secondary | ICD-10-CM | POA: Diagnosis not present

## 2023-08-22 ENCOUNTER — Ambulatory Visit (INDEPENDENT_AMBULATORY_CARE_PROVIDER_SITE_OTHER)

## 2023-08-22 VITALS — Ht <= 58 in | Wt 160.0 lb

## 2023-08-22 DIAGNOSIS — Z0000 Encounter for general adult medical examination without abnormal findings: Secondary | ICD-10-CM | POA: Diagnosis not present

## 2023-08-22 NOTE — Patient Instructions (Signed)
 Erica [name] , Thank you for taking time to come for your Medicare Wellness Visit. I appreciate your ongoing commitment to your health goals. Please review the following plan we discussed and let me know if I can assist you in the future.   Referrals/Orders/Follow-Ups/Clinician Recommendations: It was nice talking to you today.    This is a list of the screening recommended for you and due dates:  Health Maintenance  Topic Date Due   Hepatitis C Screening  Never done   Colon Cancer Screening  Never done   Mammogram  Never done   DEXA scan (bone density measurement)  Never done   COVID-19 Vaccine (8 - 2024-25 season) 01/09/2023   Medicare Annual Wellness Visit  05/19/2023   Flu Shot  12/09/2023   DTaP/Tdap/Td vaccine (2 - Td or Tdap) 03/30/2027   Pneumonia Vaccine  Completed   Zoster (Shingles) Vaccine  Completed   HPV Vaccine  Aged Out   Meningitis B Vaccine  Aged Out    Advanced directives: (Copy Requested) Please bring a copy of your health care power of attorney and living will to the office to be added to your chart at your convenience. You can mail to [facility] [address] [place], [place] [address] or email to [email or web address]  Next Medicare Annual Wellness Visit scheduled for next year: Yes

## 2023-08-22 NOTE — Progress Notes (Signed)
 Subjective:   Erica [name] is a 73 y.o. who presents for a Medicare Wellness preventive visit.  Visit Complete: Virtual I connected with  Erica [name] on 08/22/23 by a video and audio enabled telemedicine application and verified that I am speaking with the correct person using two identifiers.  Patient Location: Home  Provider Location: Home Office  I discussed the limitations of evaluation and management by telemedicine. The patient expressed understanding and agreed to proceed.  Vital Signs: Because this visit was a virtual/telehealth visit, some criteria may be missing or patient reported. Any vitals not documented were not able to be obtained and vitals that have been documented are patient reported.   Persons Participating in Visit: Patient.  AWV Questionnaire: No: Patient Medicare AWV questionnaire was not completed prior to this visit.  Cardiac Risk Factors include: advanced age (>64men, >45 women);hypertension;Other (see comment), Risk factor comments: Asthma     Objective:    Today's Vitals   08/22/23 1451  Weight: 160 lb (72.6 kg)  Height: 4' 8.75" (1.441 m)   Body mass index is 34.93 kg/m.     08/22/2023    3:08 PM  Advanced Directives  Does Patient Have a Medical Advance Directive? Yes  Type of Estate agent of [place];Living will  Copy of Healthcare Power of Attorney in Chart? No - copy requested    Current Medications (verified) Outpatient Encounter Medications as of 08/22/2023  Medication Sig   albuterol (PROVENTIL) (2.5 MG/3ML) 0.083% nebulizer solution Take 3 mLs (2.5 mg total) by nebulization every 6 (six) hours as needed for wheezing or shortness of breath.   albuterol (VENTOLIN HFA) 108 (90 Base) MCG/ACT inhaler Inhale 2 puffs into the lungs every 6 (six) hours as needed.   ALPRAZolam (XANAX) 0.25 MG tablet Take 1 tablet (0.25 mg total) by mouth 2 (two) times daily as needed for anxiety.   amLODipine-valsartan (EXFORGE) 5-160 MG  tablet Take 1 tablet by mouth daily.   budesonide-formoterol (SYMBICORT) 80-4.5 MCG/ACT inhaler Inhale 2 puffs into the lungs in the morning and at bedtime.   DULoxetine (CYMBALTA) 30 MG capsule Take 1 capsule (30 mg total) by mouth daily. Take with 60 mg for total dose of 90 mg daily   DULoxetine (CYMBALTA) 60 MG capsule Take 1 capsule (60 mg total) by mouth daily.   Eszopiclone 3 MG TABS Take 1 tablet (3 mg total) by mouth at bedtime. Take immediately before bedtime   fluticasone (VERAMYST) 27.5 MCG/SPRAY nasal spray Place 2 sprays into the nose daily as needed for rhinitis.   levocetirizine (XYZAL) 5 MG tablet Take 5 mg by mouth every evening.   omeprazole (PRILOSEC) 20 MG capsule Take 1 capsule (20 mg total) by mouth 2 (two) times daily.   No facility-administered encounter medications on file as of 08/22/2023.    Allergies (verified) Betadine [povidone iodine]   History: Past Medical History:  Diagnosis Date   Asthma    History reviewed. No pertinent surgical history. Family History  Problem Relation Age of Onset   Anesthesia problems Brother    Social History   Socioeconomic History   Marital status: Married    Spouse name: Erica [name]   Number of children: 1   Years of education: Not on file   Highest education level: Not on file  Occupational History   Occupation: RETIRED  Tobacco Use   Smoking status: Never   Smokeless tobacco: Never  Vaping Use   Vaping status: Never Used  Substance and Sexual Activity  Alcohol use: Yes    Comment: sometimes   Drug use: Not on file   Sexual activity: Not on file  Other Topics Concern   Not on file  Social History Narrative   Lives at with husband and 1 dog   Social Drivers of Health   Financial Resource Strain: Low Risk  (08/22/2023)   Overall Financial Resource Strain (CARDIA)    Difficulty of Paying Living Expenses: Not hard at all  Food Insecurity: No Food Insecurity (08/22/2023)   Hunger Vital Sign    Worried About Running  Out of Food in the Last Year: Never true    Ran Out of Food in the Last Year: Never true  Transportation Needs: No Transportation Needs (08/22/2023)   PRAPARE - Administrator, Civil Service (Medical): No    Lack of Transportation (Non-Medical): No  Physical Activity: Insufficiently Active (08/22/2023)   Exercise Vital Sign    Days of Exercise per Week: 2 days    Minutes of Exercise per Session: 60 min  Stress: No Stress Concern Present (08/22/2023)   [employer] of Occupational Health - Occupational Stress Questionnaire    Feeling of Stress : Not at all  Social Connections: Moderately Isolated (08/22/2023)   Social Connection and Isolation Panel [NHANES]    Frequency of Communication with Friends and Family: Three times a week    Frequency of Social Gatherings with Friends and Family: Three times a week    Attends Religious Services: Never    Active Member of Clubs or Organizations: No    Attends Banker Meetings: Never    Marital Status: Married    Tobacco Counseling Counseling given: Not Answered    Clinical Intake:  Pre-visit preparation completed: Yes  Pain : No/denies pain     BMI - recorded: 34.93 Nutritional Status: BMI > 30  Obese Nutritional Risks: None Diabetes: No  No results found for: "HGBA1C"   How often do you need to have someone help you when you read instructions, pamphlets, or other written materials from your doctor or pharmacy?: 1 - Never  Interpreter Needed?: No  Information entered by :: Erica [name], RMA   Activities of Daily Living     08/22/2023    2:51 PM  In your present state of health, do you have any difficulty performing the following activities:  Hearing? 0  Vision? 0  Difficulty concentrating or making decisions? 0  Walking or climbing stairs? 0  Dressing or bathing? 0  Doing errands, shopping? 0  Preparing Food and eating ? N  Using the Toilet? N  In the past six months, have you accidently  leaked urine? N  Do you have problems with loss of bowel control? N  Managing your Medications? N  Managing your Finances? N  Housekeeping or managing your Housekeeping? N    Patient Care Team: Erica Broker, MD as PCP - General (Internal Medicine) Erica George, MD as Consulting Physician (Ophthalmology) Erica Flow, MD as Consulting Physician (Ophthalmology)  Indicate any recent Medical Services you may have received from other than [facility] providers in the past year (date may be approximate).     Assessment:   This is a routine wellness examination for Erica [name].  Hearing/Vision screen Hearing Screening - Comments:: Denies hearing difficulties   Vision Screening - Comments:: Wears eyeglasses   Goals Addressed             This Visit's Progress    Patient Stated  To stay healthy       Depression Screen     08/22/2023    3:11 PM 11/22/2022    1:08 PM 08/02/2022    3:23 PM 05/18/2022   10:08 AM 02/10/2022    9:53 AM 02/09/2022   10:10 AM  PHQ 2/9 Scores  PHQ - 2 Score 0 0 0 0 0 3  PHQ- 9 Score 0 0 0 0 1 6    Fall Risk     08/22/2023    3:09 PM 11/22/2022    1:08 PM 08/02/2022    3:23 PM 05/18/2022   10:08 AM 02/10/2022    9:54 AM  Fall Risk   Falls in the past year? 0 0 0 0 0  Number falls in past yr: 0 0 0 0 0  Injury with Fall? 0 0 0 0 0  Risk for fall due to : No Fall Risks      Follow up Falls prevention discussed;Falls evaluation completed Falls evaluation completed Falls evaluation completed Falls evaluation completed     MEDICARE RISK AT HOME:  Medicare Risk at Home Any stairs in or around the home?: No (lives at [facility]) Home free of loose throw rugs in walkways, pet beds, electrical cords, etc?: Yes Adequate lighting in your home to reduce risk of falls?: Yes Life alert?: Yes (lives at [facility]) Use of a cane, walker or w/c?: No Grab bars in the bathroom?: Yes Shower chair or bench in shower?: Yes Elevated toilet seat or a  handicapped toilet?: Yes  TIMED UP AND GO:  Was the test performed?  No  Cognitive Function: 6CIT completed        Immunizations Immunization History  Administered Date(s) Administered   Fluad Trivalent(High Dose 65+) 02/25/2023   Influenza, High Dose Seasonal PF 02/07/2019   Influenza-Unspecified 02/28/2015, 02/24/2022   PNEUMOCOCCAL CONJUGATE-20 05/18/2022   Pneumococcal Conjugate-13 04/03/2018   Pneumococcal-Unspecified 04/03/2018   Respiratory Syncytial Virus Vaccine,Recomb Aduvanted(Arexvy) 01/28/2023   Tdap 03/29/2017   Unspecified SARS-COV-2 Vaccination 06/01/2019, 06/22/2019, 02/05/2020, 08/14/2020, 01/14/2021, 09/23/2021, 02/27/2022   Zoster Recombinant(Shingrix) 10/04/2017, 02/22/2018   Zoster, Live 10/04/2017, 02/22/2018    Screening Tests Health Maintenance  Topic Date Due   Hepatitis C Screening  Never done   Colonoscopy  Never done   MAMMOGRAM  Never done   DEXA SCAN  Never done   COVID-19 Vaccine (8 - 2024-25 season) 01/09/2023   Medicare Annual Wellness (AWV)  05/19/2023   INFLUENZA VACCINE  12/09/2023   DTaP/Tdap/Td (2 - Td or Tdap) 03/30/2027   Pneumonia Vaccine 59+ Years old  Completed   Zoster Vaccines- Shingrix  Completed   HPV VACCINES  Aged Out   Meningococcal B Vaccine  Aged Out    Health Maintenance  Health Maintenance Due  Topic Date Due   Hepatitis C Screening  Never done   Colonoscopy  Never done   MAMMOGRAM  Never done   DEXA SCAN  Never done   COVID-19 Vaccine (8 - 2024-25 season) 01/09/2023   Medicare Annual Wellness (AWV)  05/19/2023   Health Maintenance Items Addressed: See Nurse Notes  Additional Screening:  Vision Screening: Recommended annual ophthalmology exams for early detection of glaucoma and other disorders of the eye.  Dental Screening: Recommended annual dental exams for proper oral hygiene  Community Resource Referral / Chronic Care Management: CRR required this visit?  No   CCM required this visit?   No     Plan:     I have personally  reviewed and noted the following in the patient's chart:   Medical and social history Use of alcohol, tobacco or illicit drugs  Current medications and supplements including opioid prescriptions. Patient is not currently taking opioid prescriptions. Functional ability and status Nutritional status Physical activity Advanced directives List of other physicians Hospitalizations, surgeries, and ER visits in previous 12 months Vitals Screenings to include cognitive, depression, and falls Referrals and appointments  In addition, I have reviewed and discussed with patient certain preventive protocols, quality metrics, and best practice recommendations. A written personalized care plan for preventive services as well as general preventive health recommendations were provided to patient.     Erica [name], CMA   08/22/2023   After Visit Summary: (MyChart) Due to this being a telephonic visit, the after visit summary with patients personalized plan was offered to patient via MyChart   Notes: Please refer to Routing Comments.

## 2023-08-24 DIAGNOSIS — R262 Difficulty in walking, not elsewhere classified: Secondary | ICD-10-CM | POA: Diagnosis not present

## 2023-08-24 DIAGNOSIS — M25552 Pain in left hip: Secondary | ICD-10-CM | POA: Diagnosis not present

## 2023-08-24 DIAGNOSIS — M6281 Muscle weakness (generalized): Secondary | ICD-10-CM | POA: Diagnosis not present

## 2023-08-26 DIAGNOSIS — R262 Difficulty in walking, not elsewhere classified: Secondary | ICD-10-CM | POA: Diagnosis not present

## 2023-08-26 DIAGNOSIS — M25552 Pain in left hip: Secondary | ICD-10-CM | POA: Diagnosis not present

## 2023-08-26 DIAGNOSIS — M6281 Muscle weakness (generalized): Secondary | ICD-10-CM | POA: Diagnosis not present

## 2023-08-31 DIAGNOSIS — M25552 Pain in left hip: Secondary | ICD-10-CM | POA: Diagnosis not present

## 2023-08-31 DIAGNOSIS — M6281 Muscle weakness (generalized): Secondary | ICD-10-CM | POA: Diagnosis not present

## 2023-08-31 DIAGNOSIS — R262 Difficulty in walking, not elsewhere classified: Secondary | ICD-10-CM | POA: Diagnosis not present

## 2023-09-02 DIAGNOSIS — M25552 Pain in left hip: Secondary | ICD-10-CM | POA: Diagnosis not present

## 2023-09-02 DIAGNOSIS — M6281 Muscle weakness (generalized): Secondary | ICD-10-CM | POA: Diagnosis not present

## 2023-09-02 DIAGNOSIS — R262 Difficulty in walking, not elsewhere classified: Secondary | ICD-10-CM | POA: Diagnosis not present

## 2023-09-07 DIAGNOSIS — M25552 Pain in left hip: Secondary | ICD-10-CM | POA: Diagnosis not present

## 2023-09-07 DIAGNOSIS — M6281 Muscle weakness (generalized): Secondary | ICD-10-CM | POA: Diagnosis not present

## 2023-09-07 DIAGNOSIS — R262 Difficulty in walking, not elsewhere classified: Secondary | ICD-10-CM | POA: Diagnosis not present

## 2023-09-09 DIAGNOSIS — M25552 Pain in left hip: Secondary | ICD-10-CM | POA: Diagnosis not present

## 2023-09-09 DIAGNOSIS — M6281 Muscle weakness (generalized): Secondary | ICD-10-CM | POA: Diagnosis not present

## 2023-09-09 DIAGNOSIS — R262 Difficulty in walking, not elsewhere classified: Secondary | ICD-10-CM | POA: Diagnosis not present

## 2023-09-14 DIAGNOSIS — M25552 Pain in left hip: Secondary | ICD-10-CM | POA: Diagnosis not present

## 2023-09-14 DIAGNOSIS — M6281 Muscle weakness (generalized): Secondary | ICD-10-CM | POA: Diagnosis not present

## 2023-09-14 DIAGNOSIS — R262 Difficulty in walking, not elsewhere classified: Secondary | ICD-10-CM | POA: Diagnosis not present

## 2023-09-16 DIAGNOSIS — R262 Difficulty in walking, not elsewhere classified: Secondary | ICD-10-CM | POA: Diagnosis not present

## 2023-09-16 DIAGNOSIS — M6281 Muscle weakness (generalized): Secondary | ICD-10-CM | POA: Diagnosis not present

## 2023-09-16 DIAGNOSIS — M25552 Pain in left hip: Secondary | ICD-10-CM | POA: Diagnosis not present

## 2023-09-23 DIAGNOSIS — H353211 Exudative age-related macular degeneration, right eye, with active choroidal neovascularization: Secondary | ICD-10-CM | POA: Diagnosis not present

## 2023-09-23 DIAGNOSIS — H353122 Nonexudative age-related macular degeneration, left eye, intermediate dry stage: Secondary | ICD-10-CM | POA: Diagnosis not present

## 2023-09-23 DIAGNOSIS — H40013 Open angle with borderline findings, low risk, bilateral: Secondary | ICD-10-CM | POA: Diagnosis not present

## 2023-09-23 DIAGNOSIS — H1045 Other chronic allergic conjunctivitis: Secondary | ICD-10-CM | POA: Diagnosis not present

## 2023-09-28 DIAGNOSIS — R262 Difficulty in walking, not elsewhere classified: Secondary | ICD-10-CM | POA: Diagnosis not present

## 2023-09-28 DIAGNOSIS — M25552 Pain in left hip: Secondary | ICD-10-CM | POA: Diagnosis not present

## 2023-09-28 DIAGNOSIS — M6281 Muscle weakness (generalized): Secondary | ICD-10-CM | POA: Diagnosis not present

## 2023-09-30 DIAGNOSIS — M6281 Muscle weakness (generalized): Secondary | ICD-10-CM | POA: Diagnosis not present

## 2023-09-30 DIAGNOSIS — R262 Difficulty in walking, not elsewhere classified: Secondary | ICD-10-CM | POA: Diagnosis not present

## 2023-09-30 DIAGNOSIS — M25552 Pain in left hip: Secondary | ICD-10-CM | POA: Diagnosis not present

## 2023-10-03 DIAGNOSIS — Z23 Encounter for immunization: Secondary | ICD-10-CM | POA: Diagnosis not present

## 2023-10-05 DIAGNOSIS — M6281 Muscle weakness (generalized): Secondary | ICD-10-CM | POA: Diagnosis not present

## 2023-10-05 DIAGNOSIS — M25552 Pain in left hip: Secondary | ICD-10-CM | POA: Diagnosis not present

## 2023-10-05 DIAGNOSIS — R262 Difficulty in walking, not elsewhere classified: Secondary | ICD-10-CM | POA: Diagnosis not present

## 2023-10-07 DIAGNOSIS — M6281 Muscle weakness (generalized): Secondary | ICD-10-CM | POA: Diagnosis not present

## 2023-10-07 DIAGNOSIS — R262 Difficulty in walking, not elsewhere classified: Secondary | ICD-10-CM | POA: Diagnosis not present

## 2023-10-07 DIAGNOSIS — M25552 Pain in left hip: Secondary | ICD-10-CM | POA: Diagnosis not present

## 2023-10-10 ENCOUNTER — Encounter (INDEPENDENT_AMBULATORY_CARE_PROVIDER_SITE_OTHER): Admitting: Ophthalmology

## 2023-10-10 DIAGNOSIS — I10 Essential (primary) hypertension: Secondary | ICD-10-CM

## 2023-10-10 DIAGNOSIS — H353211 Exudative age-related macular degeneration, right eye, with active choroidal neovascularization: Secondary | ICD-10-CM | POA: Diagnosis not present

## 2023-10-10 DIAGNOSIS — H43813 Vitreous degeneration, bilateral: Secondary | ICD-10-CM

## 2023-10-10 DIAGNOSIS — H35033 Hypertensive retinopathy, bilateral: Secondary | ICD-10-CM

## 2023-10-10 DIAGNOSIS — H353122 Nonexudative age-related macular degeneration, left eye, intermediate dry stage: Secondary | ICD-10-CM

## 2023-10-12 DIAGNOSIS — R262 Difficulty in walking, not elsewhere classified: Secondary | ICD-10-CM | POA: Diagnosis not present

## 2023-10-12 DIAGNOSIS — M6281 Muscle weakness (generalized): Secondary | ICD-10-CM | POA: Diagnosis not present

## 2023-10-12 DIAGNOSIS — M25552 Pain in left hip: Secondary | ICD-10-CM | POA: Diagnosis not present

## 2023-10-14 DIAGNOSIS — M6281 Muscle weakness (generalized): Secondary | ICD-10-CM | POA: Diagnosis not present

## 2023-10-14 DIAGNOSIS — R262 Difficulty in walking, not elsewhere classified: Secondary | ICD-10-CM | POA: Diagnosis not present

## 2023-10-14 DIAGNOSIS — M25552 Pain in left hip: Secondary | ICD-10-CM | POA: Diagnosis not present

## 2023-10-18 ENCOUNTER — Encounter: Payer: Self-pay | Admitting: Internal Medicine

## 2023-10-19 DIAGNOSIS — M25552 Pain in left hip: Secondary | ICD-10-CM | POA: Diagnosis not present

## 2023-10-19 DIAGNOSIS — R262 Difficulty in walking, not elsewhere classified: Secondary | ICD-10-CM | POA: Diagnosis not present

## 2023-10-19 DIAGNOSIS — M6281 Muscle weakness (generalized): Secondary | ICD-10-CM | POA: Diagnosis not present

## 2023-10-20 NOTE — Telephone Encounter (Signed)
 Please send in for the patient

## 2023-10-21 DIAGNOSIS — M6281 Muscle weakness (generalized): Secondary | ICD-10-CM | POA: Diagnosis not present

## 2023-10-21 DIAGNOSIS — R262 Difficulty in walking, not elsewhere classified: Secondary | ICD-10-CM | POA: Diagnosis not present

## 2023-10-21 DIAGNOSIS — M25552 Pain in left hip: Secondary | ICD-10-CM | POA: Diagnosis not present

## 2023-10-24 ENCOUNTER — Other Ambulatory Visit: Payer: Self-pay | Admitting: Internal Medicine

## 2023-10-24 MED ORDER — BUDESONIDE-FORMOTEROL FUMARATE 160-4.5 MCG/ACT IN AERO: 2.0000 | INHALATION_SPRAY | Freq: Two times a day (BID) | RESPIRATORY_TRACT | 1 refills | Status: DC

## 2023-10-26 DIAGNOSIS — M25552 Pain in left hip: Secondary | ICD-10-CM | POA: Diagnosis not present

## 2023-10-26 DIAGNOSIS — M6281 Muscle weakness (generalized): Secondary | ICD-10-CM | POA: Diagnosis not present

## 2023-10-26 DIAGNOSIS — R262 Difficulty in walking, not elsewhere classified: Secondary | ICD-10-CM | POA: Diagnosis not present

## 2023-11-28 ENCOUNTER — Encounter (INDEPENDENT_AMBULATORY_CARE_PROVIDER_SITE_OTHER): Admitting: Ophthalmology

## 2023-11-28 DIAGNOSIS — H35033 Hypertensive retinopathy, bilateral: Secondary | ICD-10-CM | POA: Diagnosis not present

## 2023-11-28 DIAGNOSIS — I10 Essential (primary) hypertension: Secondary | ICD-10-CM | POA: Diagnosis not present

## 2023-11-28 DIAGNOSIS — H353211 Exudative age-related macular degeneration, right eye, with active choroidal neovascularization: Secondary | ICD-10-CM

## 2023-11-28 DIAGNOSIS — H353122 Nonexudative age-related macular degeneration, left eye, intermediate dry stage: Secondary | ICD-10-CM | POA: Diagnosis not present

## 2023-11-28 DIAGNOSIS — H43813 Vitreous degeneration, bilateral: Secondary | ICD-10-CM

## 2023-12-14 DIAGNOSIS — M1711 Unilateral primary osteoarthritis, right knee: Secondary | ICD-10-CM | POA: Diagnosis not present

## 2023-12-16 DIAGNOSIS — H04223 Epiphora due to insufficient drainage, bilateral lacrimal glands: Secondary | ICD-10-CM | POA: Diagnosis not present

## 2024-01-23 ENCOUNTER — Encounter (INDEPENDENT_AMBULATORY_CARE_PROVIDER_SITE_OTHER): Admitting: Ophthalmology

## 2024-01-23 DIAGNOSIS — H353211 Exudative age-related macular degeneration, right eye, with active choroidal neovascularization: Secondary | ICD-10-CM

## 2024-01-23 DIAGNOSIS — H35033 Hypertensive retinopathy, bilateral: Secondary | ICD-10-CM | POA: Diagnosis not present

## 2024-01-23 DIAGNOSIS — H353122 Nonexudative age-related macular degeneration, left eye, intermediate dry stage: Secondary | ICD-10-CM | POA: Diagnosis not present

## 2024-01-23 DIAGNOSIS — I10 Essential (primary) hypertension: Secondary | ICD-10-CM | POA: Diagnosis not present

## 2024-01-23 DIAGNOSIS — H43813 Vitreous degeneration, bilateral: Secondary | ICD-10-CM | POA: Diagnosis not present

## 2024-02-15 DIAGNOSIS — H0279 Other degenerative disorders of eyelid and periocular area: Secondary | ICD-10-CM | POA: Diagnosis not present

## 2024-02-15 DIAGNOSIS — H04123 Dry eye syndrome of bilateral lacrimal glands: Secondary | ICD-10-CM | POA: Diagnosis not present

## 2024-02-15 DIAGNOSIS — Z01818 Encounter for other preprocedural examination: Secondary | ICD-10-CM | POA: Diagnosis not present

## 2024-02-15 DIAGNOSIS — H04213 Epiphora due to excess lacrimation, bilateral lacrimal glands: Secondary | ICD-10-CM | POA: Diagnosis not present

## 2024-03-13 DIAGNOSIS — D225 Melanocytic nevi of trunk: Secondary | ICD-10-CM | POA: Diagnosis not present

## 2024-03-13 DIAGNOSIS — L821 Other seborrheic keratosis: Secondary | ICD-10-CM | POA: Diagnosis not present

## 2024-03-13 DIAGNOSIS — D2272 Melanocytic nevi of left lower limb, including hip: Secondary | ICD-10-CM | POA: Diagnosis not present

## 2024-03-13 DIAGNOSIS — Z85828 Personal history of other malignant neoplasm of skin: Secondary | ICD-10-CM | POA: Diagnosis not present

## 2024-03-13 DIAGNOSIS — D1801 Hemangioma of skin and subcutaneous tissue: Secondary | ICD-10-CM | POA: Diagnosis not present

## 2024-03-13 DIAGNOSIS — L720 Epidermal cyst: Secondary | ICD-10-CM | POA: Diagnosis not present

## 2024-03-13 DIAGNOSIS — L814 Other melanin hyperpigmentation: Secondary | ICD-10-CM | POA: Diagnosis not present

## 2024-03-19 ENCOUNTER — Encounter (INDEPENDENT_AMBULATORY_CARE_PROVIDER_SITE_OTHER): Admitting: Ophthalmology

## 2024-03-19 DIAGNOSIS — I10 Essential (primary) hypertension: Secondary | ICD-10-CM | POA: Diagnosis not present

## 2024-03-19 DIAGNOSIS — H43813 Vitreous degeneration, bilateral: Secondary | ICD-10-CM | POA: Diagnosis not present

## 2024-03-19 DIAGNOSIS — H35033 Hypertensive retinopathy, bilateral: Secondary | ICD-10-CM | POA: Diagnosis not present

## 2024-03-19 DIAGNOSIS — H353122 Nonexudative age-related macular degeneration, left eye, intermediate dry stage: Secondary | ICD-10-CM | POA: Diagnosis not present

## 2024-03-19 DIAGNOSIS — H353211 Exudative age-related macular degeneration, right eye, with active choroidal neovascularization: Secondary | ICD-10-CM

## 2024-03-22 DIAGNOSIS — M25561 Pain in right knee: Secondary | ICD-10-CM | POA: Diagnosis not present

## 2024-03-22 DIAGNOSIS — M1711 Unilateral primary osteoarthritis, right knee: Secondary | ICD-10-CM | POA: Diagnosis not present

## 2024-03-27 ENCOUNTER — Other Ambulatory Visit: Payer: Self-pay | Admitting: Internal Medicine

## 2024-03-27 MED ORDER — ESZOPICLONE 3 MG PO TABS: 3.0000 mg | ORAL_TABLET | Freq: Every day | ORAL | 5 refills | Status: AC

## 2024-03-27 MED ORDER — ALPRAZOLAM 0.25 MG PO TABS: 0.2500 mg | ORAL_TABLET | Freq: Two times a day (BID) | ORAL | 3 refills | Status: AC | PRN

## 2024-03-30 DIAGNOSIS — H16223 Keratoconjunctivitis sicca, not specified as Sjogren's, bilateral: Secondary | ICD-10-CM | POA: Diagnosis not present

## 2024-03-30 DIAGNOSIS — H0288A Meibomian gland dysfunction right eye, upper and lower eyelids: Secondary | ICD-10-CM | POA: Diagnosis not present

## 2024-03-30 DIAGNOSIS — H0102A Squamous blepharitis right eye, upper and lower eyelids: Secondary | ICD-10-CM | POA: Diagnosis not present

## 2024-03-30 DIAGNOSIS — H0279 Other degenerative disorders of eyelid and periocular area: Secondary | ICD-10-CM | POA: Diagnosis not present

## 2024-03-30 DIAGNOSIS — H0288B Meibomian gland dysfunction left eye, upper and lower eyelids: Secondary | ICD-10-CM | POA: Diagnosis not present

## 2024-03-30 DIAGNOSIS — H0102B Squamous blepharitis left eye, upper and lower eyelids: Secondary | ICD-10-CM | POA: Diagnosis not present

## 2024-04-11 DIAGNOSIS — Z6832 Body mass index (BMI) 32.0-32.9, adult: Secondary | ICD-10-CM | POA: Diagnosis not present

## 2024-04-11 DIAGNOSIS — Z1231 Encounter for screening mammogram for malignant neoplasm of breast: Secondary | ICD-10-CM | POA: Diagnosis not present

## 2024-04-11 DIAGNOSIS — N952 Postmenopausal atrophic vaginitis: Secondary | ICD-10-CM | POA: Diagnosis not present

## 2024-04-11 DIAGNOSIS — Z01419 Encounter for gynecological examination (general) (routine) without abnormal findings: Secondary | ICD-10-CM | POA: Diagnosis not present

## 2024-04-11 LAB — HM MAMMOGRAPHY

## 2024-04-24 DIAGNOSIS — M25561 Pain in right knee: Secondary | ICD-10-CM | POA: Diagnosis not present

## 2024-05-09 ENCOUNTER — Other Ambulatory Visit: Payer: Self-pay | Admitting: Internal Medicine

## 2024-05-14 ENCOUNTER — Encounter (INDEPENDENT_AMBULATORY_CARE_PROVIDER_SITE_OTHER): Admitting: Ophthalmology

## 2024-05-14 DIAGNOSIS — I10 Essential (primary) hypertension: Secondary | ICD-10-CM | POA: Diagnosis not present

## 2024-05-14 DIAGNOSIS — H35033 Hypertensive retinopathy, bilateral: Secondary | ICD-10-CM

## 2024-05-14 DIAGNOSIS — H353211 Exudative age-related macular degeneration, right eye, with active choroidal neovascularization: Secondary | ICD-10-CM

## 2024-05-14 DIAGNOSIS — H43813 Vitreous degeneration, bilateral: Secondary | ICD-10-CM | POA: Diagnosis not present

## 2024-05-14 DIAGNOSIS — H353122 Nonexudative age-related macular degeneration, left eye, intermediate dry stage: Secondary | ICD-10-CM | POA: Diagnosis not present

## 2024-05-22 ENCOUNTER — Ambulatory Visit: Admitting: Internal Medicine

## 2024-05-22 ENCOUNTER — Encounter: Payer: Self-pay | Admitting: Internal Medicine

## 2024-05-22 VITALS — BP 118/76 | HR 80 | Temp 98.0°F | Ht <= 58 in | Wt 162.2 lb

## 2024-05-22 DIAGNOSIS — F4323 Adjustment disorder with mixed anxiety and depressed mood: Secondary | ICD-10-CM

## 2024-05-22 DIAGNOSIS — E559 Vitamin D deficiency, unspecified: Secondary | ICD-10-CM | POA: Diagnosis not present

## 2024-05-22 DIAGNOSIS — Z85038 Personal history of other malignant neoplasm of large intestine: Secondary | ICD-10-CM

## 2024-05-22 DIAGNOSIS — J4520 Mild intermittent asthma, uncomplicated: Secondary | ICD-10-CM

## 2024-05-22 DIAGNOSIS — K219 Gastro-esophageal reflux disease without esophagitis: Secondary | ICD-10-CM | POA: Diagnosis not present

## 2024-05-22 DIAGNOSIS — R5383 Other fatigue: Secondary | ICD-10-CM

## 2024-05-22 DIAGNOSIS — R062 Wheezing: Secondary | ICD-10-CM

## 2024-05-22 DIAGNOSIS — I10 Essential (primary) hypertension: Secondary | ICD-10-CM

## 2024-05-22 LAB — CBC
HCT: 39.8 % (ref 36.0–46.0)
Hemoglobin: 13.4 g/dL (ref 12.0–15.0)
MCHC: 33.5 g/dL (ref 30.0–36.0)
MCV: 87.3 fl (ref 78.0–100.0)
Platelets: 271 K/uL (ref 150.0–400.0)
RBC: 4.56 Mil/uL (ref 3.87–5.11)
RDW: 14 % (ref 11.5–15.5)
WBC: 7.1 K/uL (ref 4.0–10.5)

## 2024-05-22 LAB — COMPREHENSIVE METABOLIC PANEL WITH GFR
ALT: 91 U/L — ABNORMAL HIGH (ref 3–35)
AST: 46 U/L — ABNORMAL HIGH (ref 5–37)
Albumin: 4.1 g/dL (ref 3.5–5.2)
Alkaline Phosphatase: 121 U/L — ABNORMAL HIGH (ref 39–117)
BUN: 18 mg/dL (ref 6–23)
CO2: 24 meq/L (ref 19–32)
Calcium: 8.9 mg/dL (ref 8.4–10.5)
Chloride: 107 meq/L (ref 96–112)
Creatinine, Ser: 0.44 mg/dL (ref 0.40–1.20)
GFR: 97.48 mL/min
Glucose, Bld: 88 mg/dL (ref 70–99)
Potassium: 3.6 meq/L (ref 3.5–5.1)
Sodium: 140 meq/L (ref 135–145)
Total Bilirubin: 0.4 mg/dL (ref 0.2–1.2)
Total Protein: 7 g/dL (ref 6.0–8.3)

## 2024-05-22 LAB — LIPID PANEL
Cholesterol: 202 mg/dL — ABNORMAL HIGH (ref 28–200)
HDL: 68.3 mg/dL
LDL Cholesterol: 112 mg/dL — ABNORMAL HIGH (ref 10–99)
NonHDL: 133.46
Total CHOL/HDL Ratio: 3
Triglycerides: 108 mg/dL (ref 10.0–149.0)
VLDL: 21.6 mg/dL (ref 0.0–40.0)

## 2024-05-22 LAB — VITAMIN D 25 HYDROXY (VIT D DEFICIENCY, FRACTURES): VITD: 20.63 ng/mL — ABNORMAL LOW (ref 30.00–100.00)

## 2024-05-22 LAB — VITAMIN B12: Vitamin B-12: 533 pg/mL (ref 211–911)

## 2024-05-22 LAB — TSH: TSH: 0.92 u[IU]/mL (ref 0.35–5.50)

## 2024-05-22 MED ORDER — OMEPRAZOLE 20 MG PO CPDR: 20.0000 mg | DELAYED_RELEASE_CAPSULE | Freq: Two times a day (BID) | ORAL | 3 refills | Status: AC

## 2024-05-22 MED ORDER — AMLODIPINE BESYLATE-VALSARTAN 5-160 MG PO TABS: 1.0000 | ORAL_TABLET | Freq: Every day | ORAL | 3 refills | Status: AC

## 2024-05-22 MED ORDER — DULOXETINE HCL 30 MG PO CPEP: 30.0000 mg | ORAL_CAPSULE | Freq: Every day | ORAL | 3 refills | Status: AC

## 2024-05-22 MED ORDER — BUDESONIDE-FORMOTEROL FUMARATE 160-4.5 MCG/ACT IN AERO: 2.0000 | INHALATION_SPRAY | Freq: Two times a day (BID) | RESPIRATORY_TRACT | 3 refills | Status: AC

## 2024-05-22 MED ORDER — DULOXETINE HCL 60 MG PO CPEP: 60.0000 mg | ORAL_CAPSULE | Freq: Every day | ORAL | 3 refills | Status: AC

## 2024-05-22 MED ORDER — ALBUTEROL SULFATE HFA 108 (90 BASE) MCG/ACT IN AERS: 2.0000 | INHALATION_SPRAY | Freq: Four times a day (QID) | RESPIRATORY_TRACT | 3 refills | Status: AC | PRN

## 2024-05-22 NOTE — Patient Instructions (Addendum)
 We will get the records on the bone density.

## 2024-05-22 NOTE — Progress Notes (Signed)
" ° °  Subjective:   Patient ID: Erica [name], female    DOB: March 21, 1951, 74 y.o.   MRN: [id number]  Discussed the use of AI scribe software for clinical note transcription with the patient, who gave verbal consent to proceed.  History of Present Illness Erica [name] is a 74 year old female who presents for follow-up regarding leg pain and asthma management.  She experiences ongoing leg pain, which has been managed with injections providing some relief. Her symptoms have slightly progressed after a period of improvement following six months of physical therapy. It has been a couple of years since her last MRI. She continues to engage in regular walking and exercise.  Her asthma is stable, with no recent chest pain, tightness, or pressure. She uses albuterol  and has not needed her nebulizer since last year. She describes her breathing as 'okay'.  She experiences dry eye, which is improving with the use of a specific vitamin supplement that addresses the oil deficiency in her tear ducts.  She has concerns about weight management, noting difficulty losing weight despite not being a 'sweet eater' or 'sitter'. She has not gained significantly since last year.  No recent stomach issues such as diarrhea, constipation, or heartburn. She is up to date on her flu shot, the latest COVID vaccine, RSV vaccine, and shingles and tetanus vaccinations. She had a mammogram in December and plans to coordinate her bone density test with her regular doctor.  Review of Systems  Constitutional: Negative.   HENT: Negative.    Eyes: Negative.   Respiratory:  Negative for cough, chest tightness and shortness of breath.   Cardiovascular:  Negative for chest pain, palpitations and leg swelling.  Gastrointestinal:  Negative for abdominal distention, abdominal pain, constipation, diarrhea, nausea and vomiting.  Musculoskeletal: Negative.   Skin: Negative.   Neurological: Negative.   Psychiatric/Behavioral: Negative.       Objective:  Physical Exam Constitutional:      Appearance: She is well-developed.  HENT:     Head: Normocephalic and atraumatic.  Cardiovascular:     Rate and Rhythm: Normal rate and regular rhythm.  Pulmonary:     Effort: Pulmonary effort is normal. No respiratory distress.     Breath sounds: Normal breath sounds. No wheezing or rales.  Abdominal:     General: Bowel sounds are normal. There is no distension.     Palpations: Abdomen is soft.     Tenderness: There is no abdominal tenderness.  Musculoskeletal:     Cervical back: Normal range of motion.  Skin:    General: Skin is warm and dry.  Neurological:     Mental Status: She is alert and oriented to person, place, and time.     Coordination: Coordination normal.     Vitals:   05/22/24 1029  BP: 118/76  Pulse: 80  Temp: 98 F (36.7 C)  SpO2: 99%  Weight: 162 lb 3.2 oz (73.6 kg)  Height: 4' 8.75 (1.441 m)    Assessment and Plan  "

## 2024-05-24 LAB — CEA: CEA: <2 ng/mL

## 2024-05-25 NOTE — Assessment & Plan Note (Signed)
 Checking CEA and up to date on colon cancer screening.

## 2024-05-25 NOTE — Assessment & Plan Note (Signed)
 Continue current regimen.

## 2024-05-25 NOTE — Assessment & Plan Note (Signed)
 Stable on current regimen and will continue.

## 2024-05-25 NOTE — Assessment & Plan Note (Signed)
 Refilled inhalers

## 2024-05-25 NOTE — Assessment & Plan Note (Signed)
 BMI 35.4 and complicated by essential hypertension. Weight stable. Discussed challenges with weight loss and potential benefits of the Wegovy pill, aerobic exercise, and dietary changes. Consider Wegovy pill for weight management. Encouraged aerobic exercise and discussed dietary changes with a nutritionist.

## 2024-05-25 NOTE — Assessment & Plan Note (Signed)
 Checking CMP and adjust as needed.

## 2024-05-28 ENCOUNTER — Ambulatory Visit: Payer: Self-pay | Admitting: Internal Medicine

## 2024-05-28 DIAGNOSIS — I10 Essential (primary) hypertension: Secondary | ICD-10-CM

## 2024-07-02 ENCOUNTER — Encounter (INDEPENDENT_AMBULATORY_CARE_PROVIDER_SITE_OTHER): Admitting: Ophthalmology
